# Patient Record
Sex: Female | Born: 1940
Health system: Southern US, Community
[De-identification: ages and names within clinical notes are randomized; demographics above are authoritative.]

## PROBLEM LIST (undated history)

## (undated) DIAGNOSIS — I10 Essential (primary) hypertension: Secondary | ICD-10-CM

## (undated) DIAGNOSIS — C801 Malignant (primary) neoplasm, unspecified: Secondary | ICD-10-CM

## (undated) DIAGNOSIS — I2510 Atherosclerotic heart disease of native coronary artery without angina pectoris: Secondary | ICD-10-CM

## (undated) DIAGNOSIS — G8929 Other chronic pain: Secondary | ICD-10-CM

## (undated) HISTORY — PX: CARDIAC CATHETERIZATION: SHX172

## (undated) HISTORY — PX: MASTECTOMY: SHX3

## (undated) HISTORY — PX: ABDOMINAL HYSTERECTOMY: SHX81

---

## 2016-03-03 DIAGNOSIS — I2510 Atherosclerotic heart disease of native coronary artery without angina pectoris: Secondary | ICD-10-CM | POA: Diagnosis not present

## 2016-03-03 DIAGNOSIS — E785 Hyperlipidemia, unspecified: Secondary | ICD-10-CM | POA: Diagnosis not present

## 2016-03-03 DIAGNOSIS — I10 Essential (primary) hypertension: Secondary | ICD-10-CM | POA: Diagnosis not present

## 2016-03-05 DIAGNOSIS — E785 Hyperlipidemia, unspecified: Secondary | ICD-10-CM | POA: Diagnosis not present

## 2016-03-05 DIAGNOSIS — E039 Hypothyroidism, unspecified: Secondary | ICD-10-CM | POA: Diagnosis not present

## 2016-03-05 DIAGNOSIS — Z853 Personal history of malignant neoplasm of breast: Secondary | ICD-10-CM | POA: Diagnosis not present

## 2016-03-05 DIAGNOSIS — N898 Other specified noninflammatory disorders of vagina: Secondary | ICD-10-CM | POA: Diagnosis not present

## 2016-03-05 DIAGNOSIS — I10 Essential (primary) hypertension: Secondary | ICD-10-CM | POA: Diagnosis not present

## 2016-03-05 DIAGNOSIS — D649 Anemia, unspecified: Secondary | ICD-10-CM | POA: Diagnosis not present

## 2016-03-25 DIAGNOSIS — N904 Leukoplakia of vulva: Secondary | ICD-10-CM | POA: Diagnosis not present

## 2016-03-25 DIAGNOSIS — N898 Other specified noninflammatory disorders of vagina: Secondary | ICD-10-CM | POA: Diagnosis not present

## 2016-04-02 DIAGNOSIS — D508 Other iron deficiency anemias: Secondary | ICD-10-CM | POA: Diagnosis not present

## 2016-04-02 DIAGNOSIS — D649 Anemia, unspecified: Secondary | ICD-10-CM | POA: Diagnosis not present

## 2016-04-14 DIAGNOSIS — G609 Hereditary and idiopathic neuropathy, unspecified: Secondary | ICD-10-CM | POA: Diagnosis not present

## 2016-04-14 DIAGNOSIS — F419 Anxiety disorder, unspecified: Secondary | ICD-10-CM | POA: Diagnosis not present

## 2016-04-14 DIAGNOSIS — I2510 Atherosclerotic heart disease of native coronary artery without angina pectoris: Secondary | ICD-10-CM | POA: Diagnosis not present

## 2016-04-14 DIAGNOSIS — I10 Essential (primary) hypertension: Secondary | ICD-10-CM | POA: Diagnosis not present

## 2016-04-14 DIAGNOSIS — G894 Chronic pain syndrome: Secondary | ICD-10-CM | POA: Diagnosis not present

## 2016-04-14 DIAGNOSIS — E785 Hyperlipidemia, unspecified: Secondary | ICD-10-CM | POA: Diagnosis not present

## 2016-04-14 DIAGNOSIS — M48061 Spinal stenosis, lumbar region without neurogenic claudication: Secondary | ICD-10-CM | POA: Diagnosis not present

## 2016-05-14 DIAGNOSIS — H6122 Impacted cerumen, left ear: Secondary | ICD-10-CM | POA: Diagnosis not present

## 2016-07-10 DIAGNOSIS — G894 Chronic pain syndrome: Secondary | ICD-10-CM | POA: Diagnosis not present

## 2016-07-10 DIAGNOSIS — M1711 Unilateral primary osteoarthritis, right knee: Secondary | ICD-10-CM | POA: Diagnosis not present

## 2016-07-10 DIAGNOSIS — M159 Polyosteoarthritis, unspecified: Secondary | ICD-10-CM | POA: Diagnosis not present

## 2016-08-26 DIAGNOSIS — H6120 Impacted cerumen, unspecified ear: Secondary | ICD-10-CM | POA: Diagnosis not present

## 2016-08-26 DIAGNOSIS — E039 Hypothyroidism, unspecified: Secondary | ICD-10-CM | POA: Diagnosis not present

## 2016-08-26 DIAGNOSIS — I10 Essential (primary) hypertension: Secondary | ICD-10-CM | POA: Diagnosis not present

## 2016-08-26 DIAGNOSIS — D649 Anemia, unspecified: Secondary | ICD-10-CM | POA: Diagnosis not present

## 2016-08-26 DIAGNOSIS — Z1389 Encounter for screening for other disorder: Secondary | ICD-10-CM | POA: Diagnosis not present

## 2016-08-26 DIAGNOSIS — E785 Hyperlipidemia, unspecified: Secondary | ICD-10-CM | POA: Diagnosis not present

## 2016-09-10 DIAGNOSIS — M1990 Unspecified osteoarthritis, unspecified site: Secondary | ICD-10-CM | POA: Diagnosis not present

## 2016-09-10 DIAGNOSIS — M609 Myositis, unspecified: Secondary | ICD-10-CM | POA: Diagnosis not present

## 2016-09-10 DIAGNOSIS — K625 Hemorrhage of anus and rectum: Secondary | ICD-10-CM | POA: Diagnosis not present

## 2016-09-10 DIAGNOSIS — I2510 Atherosclerotic heart disease of native coronary artery without angina pectoris: Secondary | ICD-10-CM | POA: Diagnosis not present

## 2016-09-10 DIAGNOSIS — Z7982 Long term (current) use of aspirin: Secondary | ICD-10-CM | POA: Diagnosis not present

## 2016-09-10 DIAGNOSIS — F419 Anxiety disorder, unspecified: Secondary | ICD-10-CM | POA: Diagnosis not present

## 2016-09-10 DIAGNOSIS — I10 Essential (primary) hypertension: Secondary | ICD-10-CM | POA: Diagnosis not present

## 2016-09-10 DIAGNOSIS — D649 Anemia, unspecified: Secondary | ICD-10-CM | POA: Diagnosis not present

## 2016-09-10 DIAGNOSIS — E785 Hyperlipidemia, unspecified: Secondary | ICD-10-CM | POA: Diagnosis not present

## 2016-09-10 DIAGNOSIS — E559 Vitamin D deficiency, unspecified: Secondary | ICD-10-CM | POA: Diagnosis not present

## 2016-09-10 DIAGNOSIS — E611 Iron deficiency: Secondary | ICD-10-CM | POA: Diagnosis not present

## 2016-09-10 DIAGNOSIS — E561 Deficiency of vitamin K: Secondary | ICD-10-CM | POA: Diagnosis not present

## 2016-09-10 DIAGNOSIS — Z7983 Long term (current) use of bisphosphonates: Secondary | ICD-10-CM | POA: Diagnosis not present

## 2016-09-10 DIAGNOSIS — Z9884 Bariatric surgery status: Secondary | ICD-10-CM | POA: Diagnosis not present

## 2016-09-10 DIAGNOSIS — Z853 Personal history of malignant neoplasm of breast: Secondary | ICD-10-CM | POA: Diagnosis not present

## 2016-09-10 DIAGNOSIS — E039 Hypothyroidism, unspecified: Secondary | ICD-10-CM | POA: Diagnosis not present

## 2016-09-10 DIAGNOSIS — E669 Obesity, unspecified: Secondary | ICD-10-CM | POA: Diagnosis not present

## 2016-09-10 DIAGNOSIS — G4700 Insomnia, unspecified: Secondary | ICD-10-CM | POA: Diagnosis not present

## 2016-09-12 DIAGNOSIS — E611 Iron deficiency: Secondary | ICD-10-CM | POA: Diagnosis not present

## 2016-10-02 DIAGNOSIS — E611 Iron deficiency: Secondary | ICD-10-CM | POA: Diagnosis not present

## 2016-10-06 DIAGNOSIS — G894 Chronic pain syndrome: Secondary | ICD-10-CM | POA: Diagnosis not present

## 2016-10-06 DIAGNOSIS — M6250 Muscle wasting and atrophy, not elsewhere classified, unspecified site: Secondary | ICD-10-CM | POA: Diagnosis not present

## 2016-10-06 DIAGNOSIS — M48061 Spinal stenosis, lumbar region without neurogenic claudication: Secondary | ICD-10-CM | POA: Diagnosis not present

## 2016-10-06 DIAGNOSIS — M1711 Unilateral primary osteoarthritis, right knee: Secondary | ICD-10-CM | POA: Diagnosis not present

## 2016-10-22 DIAGNOSIS — Z9884 Bariatric surgery status: Secondary | ICD-10-CM | POA: Diagnosis not present

## 2016-10-22 DIAGNOSIS — D509 Iron deficiency anemia, unspecified: Secondary | ICD-10-CM | POA: Diagnosis not present

## 2016-12-02 DIAGNOSIS — I10 Essential (primary) hypertension: Secondary | ICD-10-CM | POA: Diagnosis not present

## 2016-12-02 DIAGNOSIS — E559 Vitamin D deficiency, unspecified: Secondary | ICD-10-CM | POA: Diagnosis not present

## 2016-12-02 DIAGNOSIS — E039 Hypothyroidism, unspecified: Secondary | ICD-10-CM | POA: Diagnosis not present

## 2016-12-02 DIAGNOSIS — Z23 Encounter for immunization: Secondary | ICD-10-CM | POA: Diagnosis not present

## 2016-12-02 DIAGNOSIS — F329 Major depressive disorder, single episode, unspecified: Secondary | ICD-10-CM | POA: Diagnosis not present

## 2016-12-02 DIAGNOSIS — F5101 Primary insomnia: Secondary | ICD-10-CM | POA: Diagnosis not present

## 2016-12-19 DIAGNOSIS — R61 Generalized hyperhidrosis: Secondary | ICD-10-CM | POA: Diagnosis not present

## 2016-12-19 DIAGNOSIS — I10 Essential (primary) hypertension: Secondary | ICD-10-CM | POA: Diagnosis not present

## 2016-12-19 DIAGNOSIS — F5101 Primary insomnia: Secondary | ICD-10-CM | POA: Diagnosis not present

## 2016-12-31 DIAGNOSIS — M48061 Spinal stenosis, lumbar region without neurogenic claudication: Secondary | ICD-10-CM | POA: Diagnosis not present

## 2016-12-31 DIAGNOSIS — M1711 Unilateral primary osteoarthritis, right knee: Secondary | ICD-10-CM | POA: Diagnosis not present

## 2016-12-31 DIAGNOSIS — G894 Chronic pain syndrome: Secondary | ICD-10-CM | POA: Diagnosis not present

## 2016-12-31 DIAGNOSIS — M797 Fibromyalgia: Secondary | ICD-10-CM | POA: Diagnosis not present

## 2017-01-12 DIAGNOSIS — F5101 Primary insomnia: Secondary | ICD-10-CM | POA: Diagnosis not present

## 2017-01-12 DIAGNOSIS — I10 Essential (primary) hypertension: Secondary | ICD-10-CM | POA: Diagnosis not present

## 2017-01-29 DIAGNOSIS — E039 Hypothyroidism, unspecified: Secondary | ICD-10-CM | POA: Diagnosis not present

## 2017-01-29 DIAGNOSIS — Z823 Family history of stroke: Secondary | ICD-10-CM | POA: Diagnosis not present

## 2017-01-29 DIAGNOSIS — I959 Hypotension, unspecified: Secondary | ICD-10-CM | POA: Diagnosis not present

## 2017-01-29 DIAGNOSIS — M1711 Unilateral primary osteoarthritis, right knee: Secondary | ICD-10-CM | POA: Diagnosis not present

## 2017-01-29 DIAGNOSIS — R0602 Shortness of breath: Secondary | ICD-10-CM | POA: Diagnosis not present

## 2017-01-29 DIAGNOSIS — N179 Acute kidney failure, unspecified: Secondary | ICD-10-CM | POA: Diagnosis not present

## 2017-01-29 DIAGNOSIS — Z8371 Family history of colonic polyps: Secondary | ICD-10-CM | POA: Diagnosis not present

## 2017-01-29 DIAGNOSIS — E869 Volume depletion, unspecified: Secondary | ICD-10-CM | POA: Diagnosis not present

## 2017-01-29 DIAGNOSIS — M48061 Spinal stenosis, lumbar region without neurogenic claudication: Secondary | ICD-10-CM | POA: Diagnosis not present

## 2017-01-29 DIAGNOSIS — I10 Essential (primary) hypertension: Secondary | ICD-10-CM | POA: Diagnosis not present

## 2017-01-29 DIAGNOSIS — R55 Syncope and collapse: Secondary | ICD-10-CM | POA: Diagnosis not present

## 2017-01-29 DIAGNOSIS — Z90710 Acquired absence of both cervix and uterus: Secondary | ICD-10-CM | POA: Diagnosis not present

## 2017-01-29 DIAGNOSIS — G894 Chronic pain syndrome: Secondary | ICD-10-CM | POA: Diagnosis not present

## 2017-01-29 DIAGNOSIS — R531 Weakness: Secondary | ICD-10-CM | POA: Diagnosis not present

## 2017-01-29 DIAGNOSIS — G4700 Insomnia, unspecified: Secondary | ICD-10-CM | POA: Diagnosis not present

## 2017-01-29 DIAGNOSIS — M797 Fibromyalgia: Secondary | ICD-10-CM | POA: Diagnosis not present

## 2017-01-29 DIAGNOSIS — R079 Chest pain, unspecified: Secondary | ICD-10-CM | POA: Diagnosis not present

## 2017-01-29 DIAGNOSIS — Z8249 Family history of ischemic heart disease and other diseases of the circulatory system: Secondary | ICD-10-CM | POA: Diagnosis not present

## 2017-01-29 DIAGNOSIS — I2510 Atherosclerotic heart disease of native coronary artery without angina pectoris: Secondary | ICD-10-CM | POA: Diagnosis not present

## 2017-01-29 DIAGNOSIS — K5903 Drug induced constipation: Secondary | ICD-10-CM | POA: Diagnosis not present

## 2017-01-29 DIAGNOSIS — K219 Gastro-esophageal reflux disease without esophagitis: Secondary | ICD-10-CM | POA: Diagnosis not present

## 2017-01-29 DIAGNOSIS — D72829 Elevated white blood cell count, unspecified: Secondary | ICD-10-CM | POA: Diagnosis not present

## 2017-01-29 DIAGNOSIS — Z7982 Long term (current) use of aspirin: Secondary | ICD-10-CM | POA: Diagnosis not present

## 2017-01-29 DIAGNOSIS — W01198A Fall on same level from slipping, tripping and stumbling with subsequent striking against other object, initial encounter: Secondary | ICD-10-CM | POA: Diagnosis not present

## 2017-01-29 DIAGNOSIS — T50995A Adverse effect of other drugs, medicaments and biological substances, initial encounter: Secondary | ICD-10-CM | POA: Diagnosis not present

## 2017-01-29 DIAGNOSIS — Z9861 Coronary angioplasty status: Secondary | ICD-10-CM | POA: Diagnosis not present

## 2017-02-05 DIAGNOSIS — M1711 Unilateral primary osteoarthritis, right knee: Secondary | ICD-10-CM | POA: Diagnosis not present

## 2017-02-05 DIAGNOSIS — M797 Fibromyalgia: Secondary | ICD-10-CM | POA: Diagnosis not present

## 2017-02-05 DIAGNOSIS — G894 Chronic pain syndrome: Secondary | ICD-10-CM | POA: Diagnosis not present

## 2017-02-05 DIAGNOSIS — M48061 Spinal stenosis, lumbar region without neurogenic claudication: Secondary | ICD-10-CM | POA: Diagnosis not present

## 2017-02-06 ENCOUNTER — Other Ambulatory Visit: Payer: Self-pay | Admitting: *Deleted

## 2017-02-06 ENCOUNTER — Encounter: Payer: Self-pay | Admitting: *Deleted

## 2017-02-06 DIAGNOSIS — N179 Acute kidney failure, unspecified: Secondary | ICD-10-CM | POA: Diagnosis not present

## 2017-02-06 DIAGNOSIS — I9589 Other hypotension: Secondary | ICD-10-CM | POA: Diagnosis not present

## 2017-02-06 DIAGNOSIS — I10 Essential (primary) hypertension: Secondary | ICD-10-CM | POA: Diagnosis not present

## 2017-02-06 DIAGNOSIS — E861 Hypovolemia: Secondary | ICD-10-CM | POA: Diagnosis not present

## 2017-02-06 DIAGNOSIS — D72829 Elevated white blood cell count, unspecified: Secondary | ICD-10-CM | POA: Diagnosis not present

## 2017-02-06 NOTE — Patient Outreach (Signed)
[place] [facility]) Care Management  02/06/2017  Samantha Harper Nov 08, 1940 [id number]   Referral from [place]; Patient discharged from inpatient admission from [facility] 01/31/2017; determine if transition needs.   Call #1 to patient who was advised of reason for call & [facility] care management services.  HIPPA verification received from patient.   Patient voices that she was in [facility] from 12/19/-01/30/2017 due drop in blood pressure. States she had hospital follow visit with primary care provider today. Voices that she is currently taking blood pressure daily. Voices that MD has given her parameters of when to call for MD office for blood pressure readings.   Patient agrees to answer questions from transition of care assessment tool. Patient answered TOC questions but voices she is very uncomfortable giving list of medications over the phone.  Voices that she has all of her medications & takes as prescribed by her doctors. Voices she has no transportation problems & knows when she needs to call MD office or call 911. States she does have family support.   Patient offered [facility] services and advised of weekly follow up from care manager following hospital stay.  Patient voices that she is not interested in [facility] services at this time.    Plan: Send [facility] contact information to patient. Send to care management assistant for case closure. Unable to send provide closure letter (patient unable to supply full name of provider).   Sherrin Daisy, RN BSN [place] Management Coordinator [facility] Care Management  [phone]

## 2017-02-11 DIAGNOSIS — R8290 Unspecified abnormal findings in urine: Secondary | ICD-10-CM | POA: Diagnosis not present

## 2017-02-11 DIAGNOSIS — E039 Hypothyroidism, unspecified: Secondary | ICD-10-CM | POA: Diagnosis not present

## 2017-02-11 DIAGNOSIS — M810 Age-related osteoporosis without current pathological fracture: Secondary | ICD-10-CM | POA: Diagnosis not present

## 2017-02-11 DIAGNOSIS — I10 Essential (primary) hypertension: Secondary | ICD-10-CM | POA: Diagnosis not present

## 2017-02-11 DIAGNOSIS — D72829 Elevated white blood cell count, unspecified: Secondary | ICD-10-CM | POA: Diagnosis not present

## 2017-02-11 DIAGNOSIS — E785 Hyperlipidemia, unspecified: Secondary | ICD-10-CM | POA: Diagnosis not present

## 2017-02-11 DIAGNOSIS — R251 Tremor, unspecified: Secondary | ICD-10-CM | POA: Diagnosis not present

## 2017-02-18 DIAGNOSIS — M810 Age-related osteoporosis without current pathological fracture: Secondary | ICD-10-CM | POA: Diagnosis not present

## 2017-02-18 DIAGNOSIS — M7989 Other specified soft tissue disorders: Secondary | ICD-10-CM | POA: Diagnosis not present

## 2017-02-18 DIAGNOSIS — I10 Essential (primary) hypertension: Secondary | ICD-10-CM | POA: Diagnosis not present

## 2017-02-23 DIAGNOSIS — K047 Periapical abscess without sinus: Secondary | ICD-10-CM | POA: Diagnosis not present

## 2017-02-23 DIAGNOSIS — M109 Gout, unspecified: Secondary | ICD-10-CM | POA: Diagnosis not present

## 2017-02-23 DIAGNOSIS — M810 Age-related osteoporosis without current pathological fracture: Secondary | ICD-10-CM | POA: Diagnosis not present

## 2017-02-23 DIAGNOSIS — M7989 Other specified soft tissue disorders: Secondary | ICD-10-CM | POA: Diagnosis not present

## 2017-03-05 ENCOUNTER — Ambulatory Visit: Payer: PPO | Admitting: Neurology

## 2017-03-06 DIAGNOSIS — M48061 Spinal stenosis, lumbar region without neurogenic claudication: Secondary | ICD-10-CM | POA: Diagnosis not present

## 2017-03-06 DIAGNOSIS — G894 Chronic pain syndrome: Secondary | ICD-10-CM | POA: Diagnosis not present

## 2017-03-06 DIAGNOSIS — M109 Gout, unspecified: Secondary | ICD-10-CM | POA: Diagnosis not present

## 2017-03-06 DIAGNOSIS — M1711 Unilateral primary osteoarthritis, right knee: Secondary | ICD-10-CM | POA: Diagnosis not present

## 2017-03-11 DIAGNOSIS — D509 Iron deficiency anemia, unspecified: Secondary | ICD-10-CM | POA: Diagnosis not present

## 2017-03-11 DIAGNOSIS — I10 Essential (primary) hypertension: Secondary | ICD-10-CM | POA: Diagnosis not present

## 2017-03-11 DIAGNOSIS — E785 Hyperlipidemia, unspecified: Secondary | ICD-10-CM | POA: Diagnosis not present

## 2017-03-11 DIAGNOSIS — I2510 Atherosclerotic heart disease of native coronary artery without angina pectoris: Secondary | ICD-10-CM | POA: Diagnosis not present

## 2017-04-08 DIAGNOSIS — D2330 Other benign neoplasm of skin of unspecified part of face: Secondary | ICD-10-CM | POA: Diagnosis not present

## 2017-04-08 DIAGNOSIS — D225 Melanocytic nevi of trunk: Secondary | ICD-10-CM | POA: Diagnosis not present

## 2017-04-08 DIAGNOSIS — L821 Other seborrheic keratosis: Secondary | ICD-10-CM | POA: Diagnosis not present

## 2017-04-08 DIAGNOSIS — D1801 Hemangioma of skin and subcutaneous tissue: Secondary | ICD-10-CM | POA: Diagnosis not present

## 2017-04-08 DIAGNOSIS — L570 Actinic keratosis: Secondary | ICD-10-CM | POA: Diagnosis not present

## 2017-04-30 DIAGNOSIS — M109 Gout, unspecified: Secondary | ICD-10-CM | POA: Diagnosis not present

## 2017-04-30 DIAGNOSIS — G894 Chronic pain syndrome: Secondary | ICD-10-CM | POA: Diagnosis not present

## 2017-04-30 DIAGNOSIS — M1711 Unilateral primary osteoarthritis, right knee: Secondary | ICD-10-CM | POA: Diagnosis not present

## 2017-04-30 DIAGNOSIS — M48061 Spinal stenosis, lumbar region without neurogenic claudication: Secondary | ICD-10-CM | POA: Diagnosis not present

## 2017-05-28 DIAGNOSIS — H6122 Impacted cerumen, left ear: Secondary | ICD-10-CM | POA: Diagnosis not present

## 2017-05-28 DIAGNOSIS — R42 Dizziness and giddiness: Secondary | ICD-10-CM | POA: Diagnosis not present

## 2017-07-23 DIAGNOSIS — M1711 Unilateral primary osteoarthritis, right knee: Secondary | ICD-10-CM | POA: Diagnosis not present

## 2017-07-23 DIAGNOSIS — M797 Fibromyalgia: Secondary | ICD-10-CM | POA: Diagnosis not present

## 2017-07-23 DIAGNOSIS — G894 Chronic pain syndrome: Secondary | ICD-10-CM | POA: Diagnosis not present

## 2017-07-23 DIAGNOSIS — D509 Iron deficiency anemia, unspecified: Secondary | ICD-10-CM | POA: Diagnosis not present

## 2017-07-23 DIAGNOSIS — M48061 Spinal stenosis, lumbar region without neurogenic claudication: Secondary | ICD-10-CM | POA: Diagnosis not present

## 2017-08-11 DIAGNOSIS — E039 Hypothyroidism, unspecified: Secondary | ICD-10-CM | POA: Diagnosis not present

## 2017-08-11 DIAGNOSIS — R7301 Impaired fasting glucose: Secondary | ICD-10-CM | POA: Diagnosis not present

## 2017-08-11 DIAGNOSIS — I10 Essential (primary) hypertension: Secondary | ICD-10-CM | POA: Diagnosis not present

## 2017-08-11 DIAGNOSIS — E785 Hyperlipidemia, unspecified: Secondary | ICD-10-CM | POA: Diagnosis not present

## 2017-08-11 DIAGNOSIS — M810 Age-related osteoporosis without current pathological fracture: Secondary | ICD-10-CM | POA: Diagnosis not present

## 2017-08-11 DIAGNOSIS — N2581 Secondary hyperparathyroidism of renal origin: Secondary | ICD-10-CM | POA: Diagnosis not present

## 2017-09-10 ENCOUNTER — Other Ambulatory Visit: Payer: Self-pay

## 2017-09-10 NOTE — Patient Outreach (Signed)
[place] [facility]) Care Management  09/10/2017  Samantha Harper [birth year] [id number]   Telephone Screen  Referral Date: 09/10/17 Referral Source: Nurse Call Center Referral Reason: " caller states parathyroid is making her feel bad. Caller advises she is very tired. Has been going on for past 4 months." Insurance: HTA   Outreach attempt # 1 to patient. Spoke with patient. She states that she feels "a little better today." She attributes it to going to bed early last night and getting a good night sleep. Patient states that she still feels weak and tired all the time and relates it to her hyperparathyroidism. She states she has made an appt and is going to see endocrinologist in a few days. She is hopeful they will draw labs to check her levels. She states she is currently not on any treatment regimen for it but will talk with MD regarding this. Patient wanted to know symptoms of illness and RN CM provided here with some. She also voices tat she has anemia and used to have to get iron infusions. She is wondering if her levels have dropped. Advised patient that anemia can cause tiredness and weakness as well. She will talk with MD regarding this and see if those levels can be checked as well. She denies any further RN CM needs or concerns at this time. She was appreciative of follow up call.       Plan: RN CM will close case as no further interventions warranted at this time.   Enzo Montgomery, RN,BSN,CCM [place] Management Telephonic Care Management Coordinator Direct Phone: [phone] Toll Free: [phone] Fax: [phone]

## 2017-09-17 DIAGNOSIS — N2581 Secondary hyperparathyroidism of renal origin: Secondary | ICD-10-CM | POA: Diagnosis not present

## 2017-09-17 DIAGNOSIS — E559 Vitamin D deficiency, unspecified: Secondary | ICD-10-CM | POA: Diagnosis not present

## 2017-09-17 DIAGNOSIS — M810 Age-related osteoporosis without current pathological fracture: Secondary | ICD-10-CM | POA: Diagnosis not present

## 2017-09-17 DIAGNOSIS — R5383 Other fatigue: Secondary | ICD-10-CM | POA: Diagnosis not present

## 2017-10-20 DIAGNOSIS — I10 Essential (primary) hypertension: Secondary | ICD-10-CM | POA: Diagnosis not present

## 2017-10-20 DIAGNOSIS — M47816 Spondylosis without myelopathy or radiculopathy, lumbar region: Secondary | ICD-10-CM | POA: Diagnosis not present

## 2017-10-20 DIAGNOSIS — Z853 Personal history of malignant neoplasm of breast: Secondary | ICD-10-CM | POA: Diagnosis not present

## 2017-10-20 DIAGNOSIS — M1711 Unilateral primary osteoarthritis, right knee: Secondary | ICD-10-CM | POA: Diagnosis not present

## 2017-10-20 DIAGNOSIS — Z9884 Bariatric surgery status: Secondary | ICD-10-CM | POA: Diagnosis not present

## 2017-10-20 DIAGNOSIS — G8928 Other chronic postprocedural pain: Secondary | ICD-10-CM | POA: Diagnosis not present

## 2017-10-20 DIAGNOSIS — F329 Major depressive disorder, single episode, unspecified: Secondary | ICD-10-CM | POA: Diagnosis not present

## 2017-10-20 DIAGNOSIS — M7918 Myalgia, other site: Secondary | ICD-10-CM | POA: Diagnosis not present

## 2017-10-20 DIAGNOSIS — M4696 Unspecified inflammatory spondylopathy, lumbar region: Secondary | ICD-10-CM | POA: Diagnosis not present

## 2017-10-20 DIAGNOSIS — Z79891 Long term (current) use of opiate analgesic: Secondary | ICD-10-CM | POA: Diagnosis not present

## 2017-10-20 DIAGNOSIS — Z9013 Acquired absence of bilateral breasts and nipples: Secondary | ICD-10-CM | POA: Diagnosis not present

## 2017-10-20 DIAGNOSIS — I2510 Atherosclerotic heart disease of native coronary artery without angina pectoris: Secondary | ICD-10-CM | POA: Diagnosis not present

## 2017-10-20 DIAGNOSIS — Z79899 Other long term (current) drug therapy: Secondary | ICD-10-CM | POA: Diagnosis not present

## 2017-10-20 DIAGNOSIS — M170 Bilateral primary osteoarthritis of knee: Secondary | ICD-10-CM | POA: Diagnosis not present

## 2017-10-22 DIAGNOSIS — M8589 Other specified disorders of bone density and structure, multiple sites: Secondary | ICD-10-CM | POA: Diagnosis not present

## 2017-10-22 DIAGNOSIS — M85852 Other specified disorders of bone density and structure, left thigh: Secondary | ICD-10-CM | POA: Diagnosis not present

## 2017-10-22 DIAGNOSIS — M810 Age-related osteoporosis without current pathological fracture: Secondary | ICD-10-CM | POA: Diagnosis not present

## 2017-10-22 DIAGNOSIS — Z780 Asymptomatic menopausal state: Secondary | ICD-10-CM | POA: Diagnosis not present

## 2017-10-26 DIAGNOSIS — D509 Iron deficiency anemia, unspecified: Secondary | ICD-10-CM | POA: Diagnosis not present

## 2017-10-26 DIAGNOSIS — Z9884 Bariatric surgery status: Secondary | ICD-10-CM | POA: Diagnosis not present

## 2017-10-29 DIAGNOSIS — D509 Iron deficiency anemia, unspecified: Secondary | ICD-10-CM | POA: Diagnosis not present

## 2017-11-03 DIAGNOSIS — Z886 Allergy status to analgesic agent status: Secondary | ICD-10-CM | POA: Diagnosis not present

## 2017-11-03 DIAGNOSIS — Z9013 Acquired absence of bilateral breasts and nipples: Secondary | ICD-10-CM | POA: Diagnosis not present

## 2017-11-03 DIAGNOSIS — G8918 Other acute postprocedural pain: Secondary | ICD-10-CM | POA: Diagnosis not present

## 2017-11-03 DIAGNOSIS — R270 Ataxia, unspecified: Secondary | ICD-10-CM | POA: Diagnosis not present

## 2017-11-03 DIAGNOSIS — M7918 Myalgia, other site: Secondary | ICD-10-CM | POA: Diagnosis not present

## 2017-11-03 DIAGNOSIS — Z853 Personal history of malignant neoplasm of breast: Secondary | ICD-10-CM | POA: Diagnosis not present

## 2017-11-03 DIAGNOSIS — G8928 Other chronic postprocedural pain: Secondary | ICD-10-CM | POA: Diagnosis not present

## 2017-11-03 DIAGNOSIS — Z885 Allergy status to narcotic agent status: Secondary | ICD-10-CM | POA: Diagnosis not present

## 2017-11-03 DIAGNOSIS — T426X5A Adverse effect of other antiepileptic and sedative-hypnotic drugs, initial encounter: Secondary | ICD-10-CM | POA: Diagnosis not present

## 2017-11-17 DIAGNOSIS — R51 Headache: Secondary | ICD-10-CM | POA: Diagnosis not present

## 2017-11-17 DIAGNOSIS — H539 Unspecified visual disturbance: Secondary | ICD-10-CM | POA: Diagnosis not present

## 2017-11-17 DIAGNOSIS — T50905A Adverse effect of unspecified drugs, medicaments and biological substances, initial encounter: Secondary | ICD-10-CM | POA: Diagnosis not present

## 2017-11-17 DIAGNOSIS — R202 Paresthesia of skin: Secondary | ICD-10-CM | POA: Diagnosis not present

## 2017-11-17 DIAGNOSIS — R0602 Shortness of breath: Secondary | ICD-10-CM | POA: Diagnosis not present

## 2017-11-17 DIAGNOSIS — Z760 Encounter for issue of repeat prescription: Secondary | ICD-10-CM | POA: Diagnosis not present

## 2017-11-17 DIAGNOSIS — R200 Anesthesia of skin: Secondary | ICD-10-CM | POA: Diagnosis not present

## 2017-11-17 DIAGNOSIS — G8929 Other chronic pain: Secondary | ICD-10-CM | POA: Diagnosis not present

## 2017-11-17 DIAGNOSIS — R079 Chest pain, unspecified: Secondary | ICD-10-CM | POA: Diagnosis not present

## 2017-12-01 DIAGNOSIS — Z029 Encounter for administrative examinations, unspecified: Secondary | ICD-10-CM | POA: Diagnosis not present

## 2017-12-01 DIAGNOSIS — M7918 Myalgia, other site: Secondary | ICD-10-CM | POA: Diagnosis not present

## 2017-12-01 DIAGNOSIS — Z79899 Other long term (current) drug therapy: Secondary | ICD-10-CM | POA: Diagnosis not present

## 2017-12-01 DIAGNOSIS — F331 Major depressive disorder, recurrent, moderate: Secondary | ICD-10-CM | POA: Diagnosis not present

## 2017-12-01 DIAGNOSIS — M48061 Spinal stenosis, lumbar region without neurogenic claudication: Secondary | ICD-10-CM | POA: Diagnosis not present

## 2017-12-01 DIAGNOSIS — G8928 Other chronic postprocedural pain: Secondary | ICD-10-CM | POA: Diagnosis not present

## 2017-12-01 DIAGNOSIS — Z79891 Long term (current) use of opiate analgesic: Secondary | ICD-10-CM | POA: Diagnosis not present

## 2017-12-07 DIAGNOSIS — M810 Age-related osteoporosis without current pathological fracture: Secondary | ICD-10-CM | POA: Diagnosis not present

## 2017-12-07 DIAGNOSIS — E039 Hypothyroidism, unspecified: Secondary | ICD-10-CM | POA: Diagnosis not present

## 2017-12-07 DIAGNOSIS — E559 Vitamin D deficiency, unspecified: Secondary | ICD-10-CM | POA: Diagnosis not present

## 2017-12-09 DIAGNOSIS — M810 Age-related osteoporosis without current pathological fracture: Secondary | ICD-10-CM | POA: Diagnosis not present

## 2017-12-16 DIAGNOSIS — H6122 Impacted cerumen, left ear: Secondary | ICD-10-CM | POA: Diagnosis not present

## 2017-12-16 DIAGNOSIS — H8112 Benign paroxysmal vertigo, left ear: Secondary | ICD-10-CM | POA: Diagnosis not present

## 2018-02-16 DIAGNOSIS — R0789 Other chest pain: Secondary | ICD-10-CM | POA: Diagnosis not present

## 2018-02-16 DIAGNOSIS — G8929 Other chronic pain: Secondary | ICD-10-CM | POA: Diagnosis not present

## 2018-02-16 DIAGNOSIS — M47816 Spondylosis without myelopathy or radiculopathy, lumbar region: Secondary | ICD-10-CM | POA: Diagnosis not present

## 2018-02-16 DIAGNOSIS — Z79899 Other long term (current) drug therapy: Secondary | ICD-10-CM | POA: Diagnosis not present

## 2018-02-16 DIAGNOSIS — M25551 Pain in right hip: Secondary | ICD-10-CM | POA: Diagnosis not present

## 2018-02-16 DIAGNOSIS — G894 Chronic pain syndrome: Secondary | ICD-10-CM | POA: Diagnosis not present

## 2018-02-16 DIAGNOSIS — M5416 Radiculopathy, lumbar region: Secondary | ICD-10-CM | POA: Diagnosis not present

## 2018-02-24 DIAGNOSIS — M5127 Other intervertebral disc displacement, lumbosacral region: Secondary | ICD-10-CM | POA: Diagnosis not present

## 2018-02-24 DIAGNOSIS — M48061 Spinal stenosis, lumbar region without neurogenic claudication: Secondary | ICD-10-CM | POA: Diagnosis not present

## 2018-02-24 DIAGNOSIS — M4726 Other spondylosis with radiculopathy, lumbar region: Secondary | ICD-10-CM | POA: Diagnosis not present

## 2018-02-24 DIAGNOSIS — M5416 Radiculopathy, lumbar region: Secondary | ICD-10-CM | POA: Diagnosis not present

## 2018-03-05 DIAGNOSIS — D485 Neoplasm of uncertain behavior of skin: Secondary | ICD-10-CM | POA: Diagnosis not present

## 2018-03-05 DIAGNOSIS — L570 Actinic keratosis: Secondary | ICD-10-CM | POA: Diagnosis not present

## 2018-03-05 DIAGNOSIS — D3610 Benign neoplasm of peripheral nerves and autonomic nervous system, unspecified: Secondary | ICD-10-CM | POA: Diagnosis not present

## 2018-03-10 DIAGNOSIS — E559 Vitamin D deficiency, unspecified: Secondary | ICD-10-CM | POA: Diagnosis not present

## 2018-03-10 DIAGNOSIS — I10 Essential (primary) hypertension: Secondary | ICD-10-CM | POA: Diagnosis not present

## 2018-03-10 DIAGNOSIS — G894 Chronic pain syndrome: Secondary | ICD-10-CM | POA: Diagnosis not present

## 2018-03-10 DIAGNOSIS — F5101 Primary insomnia: Secondary | ICD-10-CM | POA: Diagnosis not present

## 2018-03-10 DIAGNOSIS — I2510 Atherosclerotic heart disease of native coronary artery without angina pectoris: Secondary | ICD-10-CM | POA: Diagnosis not present

## 2018-03-12 DIAGNOSIS — I10 Essential (primary) hypertension: Secondary | ICD-10-CM | POA: Diagnosis not present

## 2018-03-12 DIAGNOSIS — I2510 Atherosclerotic heart disease of native coronary artery without angina pectoris: Secondary | ICD-10-CM | POA: Diagnosis not present

## 2018-03-12 DIAGNOSIS — E559 Vitamin D deficiency, unspecified: Secondary | ICD-10-CM | POA: Diagnosis not present

## 2018-03-22 DIAGNOSIS — M25551 Pain in right hip: Secondary | ICD-10-CM | POA: Diagnosis not present

## 2018-03-22 DIAGNOSIS — G588 Other specified mononeuropathies: Secondary | ICD-10-CM | POA: Diagnosis not present

## 2018-03-22 DIAGNOSIS — M5416 Radiculopathy, lumbar region: Secondary | ICD-10-CM | POA: Diagnosis not present

## 2018-04-05 DIAGNOSIS — G588 Other specified mononeuropathies: Secondary | ICD-10-CM | POA: Diagnosis not present

## 2018-04-20 DIAGNOSIS — M5416 Radiculopathy, lumbar region: Secondary | ICD-10-CM | POA: Diagnosis not present

## 2018-04-20 DIAGNOSIS — M25561 Pain in right knee: Secondary | ICD-10-CM | POA: Diagnosis not present

## 2018-04-20 DIAGNOSIS — M25562 Pain in left knee: Secondary | ICD-10-CM | POA: Diagnosis not present

## 2018-04-20 DIAGNOSIS — Z79891 Long term (current) use of opiate analgesic: Secondary | ICD-10-CM | POA: Diagnosis not present

## 2018-04-20 DIAGNOSIS — G8929 Other chronic pain: Secondary | ICD-10-CM | POA: Diagnosis not present

## 2018-05-11 DIAGNOSIS — G894 Chronic pain syndrome: Secondary | ICD-10-CM | POA: Diagnosis not present

## 2018-05-11 DIAGNOSIS — M5416 Radiculopathy, lumbar region: Secondary | ICD-10-CM | POA: Diagnosis not present

## 2018-05-11 DIAGNOSIS — Z79891 Long term (current) use of opiate analgesic: Secondary | ICD-10-CM | POA: Diagnosis not present

## 2018-05-11 DIAGNOSIS — M792 Neuralgia and neuritis, unspecified: Secondary | ICD-10-CM | POA: Diagnosis not present

## 2018-05-11 DIAGNOSIS — G588 Other specified mononeuropathies: Secondary | ICD-10-CM | POA: Diagnosis not present

## 2018-05-11 DIAGNOSIS — M25561 Pain in right knee: Secondary | ICD-10-CM | POA: Diagnosis not present

## 2018-05-21 DIAGNOSIS — H938X2 Other specified disorders of left ear: Secondary | ICD-10-CM | POA: Diagnosis not present

## 2018-05-25 DIAGNOSIS — G8928 Other chronic postprocedural pain: Secondary | ICD-10-CM | POA: Diagnosis not present

## 2018-05-25 DIAGNOSIS — Z79891 Long term (current) use of opiate analgesic: Secondary | ICD-10-CM | POA: Diagnosis not present

## 2018-05-25 DIAGNOSIS — Z79899 Other long term (current) drug therapy: Secondary | ICD-10-CM | POA: Diagnosis not present

## 2018-05-25 DIAGNOSIS — M7918 Myalgia, other site: Secondary | ICD-10-CM | POA: Diagnosis not present

## 2018-05-25 DIAGNOSIS — G588 Other specified mononeuropathies: Secondary | ICD-10-CM | POA: Diagnosis not present

## 2018-05-25 DIAGNOSIS — M792 Neuralgia and neuritis, unspecified: Secondary | ICD-10-CM | POA: Diagnosis not present

## 2018-07-06 DIAGNOSIS — Z79891 Long term (current) use of opiate analgesic: Secondary | ICD-10-CM | POA: Diagnosis not present

## 2018-07-06 DIAGNOSIS — G588 Other specified mononeuropathies: Secondary | ICD-10-CM | POA: Diagnosis not present

## 2018-07-06 DIAGNOSIS — M792 Neuralgia and neuritis, unspecified: Secondary | ICD-10-CM | POA: Diagnosis not present

## 2018-07-06 DIAGNOSIS — M7918 Myalgia, other site: Secondary | ICD-10-CM | POA: Diagnosis not present

## 2018-07-06 DIAGNOSIS — G894 Chronic pain syndrome: Secondary | ICD-10-CM | POA: Diagnosis not present

## 2018-07-29 DIAGNOSIS — I2510 Atherosclerotic heart disease of native coronary artery without angina pectoris: Secondary | ICD-10-CM | POA: Diagnosis not present

## 2018-07-29 DIAGNOSIS — I10 Essential (primary) hypertension: Secondary | ICD-10-CM | POA: Diagnosis not present

## 2018-07-29 DIAGNOSIS — G894 Chronic pain syndrome: Secondary | ICD-10-CM | POA: Diagnosis not present

## 2018-07-29 DIAGNOSIS — D509 Iron deficiency anemia, unspecified: Secondary | ICD-10-CM | POA: Diagnosis not present

## 2018-07-29 DIAGNOSIS — R0789 Other chest pain: Secondary | ICD-10-CM | POA: Diagnosis not present

## 2018-07-29 DIAGNOSIS — E785 Hyperlipidemia, unspecified: Secondary | ICD-10-CM | POA: Diagnosis not present

## 2018-08-02 DIAGNOSIS — I080 Rheumatic disorders of both mitral and aortic valves: Secondary | ICD-10-CM | POA: Diagnosis not present

## 2018-08-10 DIAGNOSIS — G894 Chronic pain syndrome: Secondary | ICD-10-CM | POA: Diagnosis not present

## 2018-08-10 DIAGNOSIS — M5416 Radiculopathy, lumbar region: Secondary | ICD-10-CM | POA: Diagnosis not present

## 2018-08-10 DIAGNOSIS — M792 Neuralgia and neuritis, unspecified: Secondary | ICD-10-CM | POA: Diagnosis not present

## 2018-08-10 DIAGNOSIS — G588 Other specified mononeuropathies: Secondary | ICD-10-CM | POA: Diagnosis not present

## 2018-08-10 DIAGNOSIS — Z79891 Long term (current) use of opiate analgesic: Secondary | ICD-10-CM | POA: Diagnosis not present

## 2018-09-09 DIAGNOSIS — Z9884 Bariatric surgery status: Secondary | ICD-10-CM | POA: Diagnosis not present

## 2018-09-09 DIAGNOSIS — D509 Iron deficiency anemia, unspecified: Secondary | ICD-10-CM | POA: Diagnosis not present

## 2018-09-10 DIAGNOSIS — C9110 Chronic lymphocytic leukemia of B-cell type not having achieved remission: Secondary | ICD-10-CM | POA: Diagnosis not present

## 2018-09-10 DIAGNOSIS — D509 Iron deficiency anemia, unspecified: Secondary | ICD-10-CM | POA: Diagnosis not present

## 2018-10-05 DIAGNOSIS — M5416 Radiculopathy, lumbar region: Secondary | ICD-10-CM | POA: Diagnosis not present

## 2018-10-05 DIAGNOSIS — G588 Other specified mononeuropathies: Secondary | ICD-10-CM | POA: Diagnosis not present

## 2018-10-05 DIAGNOSIS — M792 Neuralgia and neuritis, unspecified: Secondary | ICD-10-CM | POA: Diagnosis not present

## 2018-10-05 DIAGNOSIS — Z79891 Long term (current) use of opiate analgesic: Secondary | ICD-10-CM | POA: Diagnosis not present

## 2018-10-05 DIAGNOSIS — G894 Chronic pain syndrome: Secondary | ICD-10-CM | POA: Diagnosis not present

## 2018-10-05 DIAGNOSIS — M25551 Pain in right hip: Secondary | ICD-10-CM | POA: Diagnosis not present

## 2018-10-12 DIAGNOSIS — R1900 Intra-abdominal and pelvic swelling, mass and lump, unspecified site: Secondary | ICD-10-CM | POA: Diagnosis not present

## 2018-10-12 DIAGNOSIS — C9110 Chronic lymphocytic leukemia of B-cell type not having achieved remission: Secondary | ICD-10-CM | POA: Diagnosis not present

## 2018-10-12 DIAGNOSIS — D72820 Lymphocytosis (symptomatic): Secondary | ICD-10-CM | POA: Diagnosis not present

## 2018-10-12 DIAGNOSIS — N200 Calculus of kidney: Secondary | ICD-10-CM | POA: Diagnosis not present

## 2018-10-13 DIAGNOSIS — C9110 Chronic lymphocytic leukemia of B-cell type not having achieved remission: Secondary | ICD-10-CM | POA: Diagnosis not present

## 2018-10-13 DIAGNOSIS — D72820 Lymphocytosis (symptomatic): Secondary | ICD-10-CM | POA: Diagnosis not present

## 2018-10-13 DIAGNOSIS — D509 Iron deficiency anemia, unspecified: Secondary | ICD-10-CM | POA: Diagnosis not present

## 2018-10-13 DIAGNOSIS — Z9884 Bariatric surgery status: Secondary | ICD-10-CM | POA: Diagnosis not present

## 2018-12-13 DIAGNOSIS — R635 Abnormal weight gain: Secondary | ICD-10-CM | POA: Diagnosis not present

## 2018-12-13 DIAGNOSIS — E039 Hypothyroidism, unspecified: Secondary | ICD-10-CM | POA: Diagnosis not present

## 2018-12-13 DIAGNOSIS — I10 Essential (primary) hypertension: Secondary | ICD-10-CM | POA: Diagnosis not present

## 2018-12-13 DIAGNOSIS — M810 Age-related osteoporosis without current pathological fracture: Secondary | ICD-10-CM | POA: Diagnosis not present

## 2018-12-13 DIAGNOSIS — R000 Tachycardia, unspecified: Secondary | ICD-10-CM | POA: Diagnosis not present

## 2018-12-13 DIAGNOSIS — E559 Vitamin D deficiency, unspecified: Secondary | ICD-10-CM | POA: Diagnosis not present

## 2018-12-14 DIAGNOSIS — Z79891 Long term (current) use of opiate analgesic: Secondary | ICD-10-CM | POA: Diagnosis not present

## 2018-12-14 DIAGNOSIS — M5416 Radiculopathy, lumbar region: Secondary | ICD-10-CM | POA: Diagnosis not present

## 2018-12-14 DIAGNOSIS — Z79899 Other long term (current) drug therapy: Secondary | ICD-10-CM | POA: Diagnosis not present

## 2018-12-14 DIAGNOSIS — G894 Chronic pain syndrome: Secondary | ICD-10-CM | POA: Diagnosis not present

## 2018-12-17 ENCOUNTER — Emergency Department (HOSPITAL_BASED_OUTPATIENT_CLINIC_OR_DEPARTMENT_OTHER): Payer: PPO

## 2018-12-17 ENCOUNTER — Other Ambulatory Visit: Payer: Self-pay

## 2018-12-17 ENCOUNTER — Encounter (HOSPITAL_BASED_OUTPATIENT_CLINIC_OR_DEPARTMENT_OTHER): Payer: Self-pay | Admitting: Emergency Medicine

## 2018-12-17 ENCOUNTER — Emergency Department (HOSPITAL_BASED_OUTPATIENT_CLINIC_OR_DEPARTMENT_OTHER)
Admission: EM | Admit: 2018-12-17 | Discharge: 2018-12-17 | Disposition: A | Discharge status: Home / Self Care | Payer: PPO | Attending: Emergency Medicine | Admitting: Emergency Medicine

## 2018-12-17 DIAGNOSIS — R0902 Hypoxemia: Secondary | ICD-10-CM | POA: Diagnosis not present

## 2018-12-17 DIAGNOSIS — Z7982 Long term (current) use of aspirin: Secondary | ICD-10-CM | POA: Insufficient documentation

## 2018-12-17 DIAGNOSIS — I10 Essential (primary) hypertension: Secondary | ICD-10-CM | POA: Insufficient documentation

## 2018-12-17 DIAGNOSIS — R52 Pain, unspecified: Secondary | ICD-10-CM | POA: Diagnosis not present

## 2018-12-17 DIAGNOSIS — I2510 Atherosclerotic heart disease of native coronary artery without angina pectoris: Secondary | ICD-10-CM | POA: Diagnosis not present

## 2018-12-17 DIAGNOSIS — I213 ST elevation (STEMI) myocardial infarction of unspecified site: Secondary | ICD-10-CM | POA: Diagnosis not present

## 2018-12-17 DIAGNOSIS — R519 Headache, unspecified: Secondary | ICD-10-CM | POA: Diagnosis not present

## 2018-12-17 DIAGNOSIS — R001 Bradycardia, unspecified: Secondary | ICD-10-CM | POA: Diagnosis not present

## 2018-12-17 DIAGNOSIS — Z79899 Other long term (current) drug therapy: Secondary | ICD-10-CM | POA: Insufficient documentation

## 2018-12-17 HISTORY — DX: Essential (primary) hypertension: I10

## 2018-12-17 HISTORY — DX: Other chronic pain: G89.29

## 2018-12-17 HISTORY — DX: Malignant (primary) neoplasm, unspecified: C80.1

## 2018-12-17 HISTORY — DX: Atherosclerotic heart disease of native coronary artery without angina pectoris: I25.10

## 2018-12-17 LAB — CBC WITH DIFFERENTIAL/PLATELET
Abs Immature Granulocytes: 0.09 10*3/uL — ABNORMAL HIGH (ref 0.00–0.07)
Basophils Absolute: 0.1 10*3/uL (ref 0.0–0.1)
Basophils Relative: 0 %
Eosinophils Absolute: 0.1 10*3/uL (ref 0.0–0.5)
Eosinophils Relative: 1 %
HCT: 44.3 % (ref 36.0–46.0)
Hemoglobin: 14.1 g/dL (ref 12.0–15.0)
Immature Granulocytes: 1 %
Lymphocytes Relative: 58 %
Lymphs Abs: 8.1 10*3/uL — ABNORMAL HIGH (ref 0.7–4.0)
MCH: 30.3 pg (ref 26.0–34.0)
MCHC: 31.8 g/dL (ref 30.0–36.0)
MCV: 95.1 fL (ref 80.0–100.0)
Monocytes Absolute: 0.8 10*3/uL (ref 0.1–1.0)
Monocytes Relative: 5 %
Neutro Abs: 4.9 10*3/uL (ref 1.7–7.7)
Neutrophils Relative %: 35 %
Platelets: 260 10*3/uL (ref 150–400)
RBC: 4.66 MIL/uL (ref 3.87–5.11)
RDW: 13.2 % (ref 11.5–15.5)
WBC: 14 10*3/uL — ABNORMAL HIGH (ref 4.0–10.5)
nRBC: 0 % (ref 0.0–0.2)

## 2018-12-17 LAB — COMPREHENSIVE METABOLIC PANEL
ALT: 14 U/L (ref 0–44)
AST: 16 U/L (ref 15–41)
Albumin: 4.1 g/dL (ref 3.5–5.0)
Alkaline Phosphatase: 88 U/L (ref 38–126)
Anion gap: 9 (ref 5–15)
BUN: 16 mg/dL (ref 8–23)
CO2: 28 mmol/L (ref 22–32)
Calcium: 9.3 mg/dL (ref 8.9–10.3)
Chloride: 103 mmol/L (ref 98–111)
Creatinine, Ser: 0.59 mg/dL (ref 0.44–1.00)
GFR calc Af Amer: >60 mL/min (ref >60)
GFR calc non Af Amer: >60 mL/min (ref >60)
Glucose, Bld: 107 mg/dL — ABNORMAL HIGH (ref 70–99)
Potassium: 4 mmol/L (ref 3.5–5.1)
Sodium: 140 mmol/L (ref 135–145)
Total Bilirubin: 0.8 mg/dL (ref 0.3–1.2)
Total Protein: 6.7 g/dL (ref 6.5–8.1)

## 2018-12-17 NOTE — ED Triage Notes (Signed)
EMS arrival form [facility] Internal Medicine for HTN. Given clonidine 0.1mg  at 1430. at MD office for elevated BP of 222/114.  Pt c/o headache and chronic pain generalized.

## 2018-12-17 NOTE — ED Provider Notes (Signed)
Samantha Harper Provider Note   CSN: [id number] Arrival date & time: 12/17/18  1530     History   Chief Complaint Chief Complaint  Patient presents with  . Hypertension    HPI Samantha Harper is a 78 y.o. female.     Patient with a history of high blood pressure.  Currently treated just with Cozaar.  Has had other medications in the past but did had a very severe hypotensive episode secondary that.  For this past week patient's blood pressures have been elevated sometimes systolics as high as [id number].  Went to urgent care the other day was treated with clonidine pressure came down but then it was back up was at primary care office today given clonidine again at 2:30 in the afternoon.  Blood pressure was 222/114.  Upon arrival here 164/96.  Patient has had the complaint of a dull headache for a week or so.  Patient has known unchanged generalized chronic pain which is mostly related to her back.  Patient denies any visual changes any speech problems any numbness or weakness.  No significant chest pain no shortness of breath.  Does not define the headache is severe.  Patient's primary care doctor did make an adjustment in her medications and phoned prescriptions and for the patient to pick up to start a new treatment regiment.  Patient does live at home by herself.  Patient does have the ability check her blood pressure at home.  Past medical history otherwise other than the chronic pain is significant for history of cancer CLL.  Coronary disease and hypertension.  In addition patient has had the complaint of some diaphoresis mostly sweats where head gets very sweaty.  Primary care doctor her endocrinologist is taken a look at her thyroid test recently and they were fine.     Past Medical History:  Diagnosis Date  . Cancer ([place])   . Chronic pain   . Coronary artery disease   . Hypertension     There are no active problems to display for this patient.   Past  Surgical History:  Procedure Laterality Date  . ABDOMINAL HYSTERECTOMY    . CARDIAC CATHETERIZATION    . MASTECTOMY    . MASTECTOMY       OB History   No obstetric history on file.      Home Medications    Prior to Admission medications   Medication Sig Start Date End Date Taking? Authorizing Provider  DULoxetine (CYMBALTA) 30 MG capsule Take by mouth. 10/05/18  Yes [provider]  estrogens, conjugated, (PREMARIN) 0.625 MG tablet  05/25/09  Yes [provider]  HYDROcodone-acetaminophen (NORCO) 10-325 MG tablet Take by mouth. 10/18/18 01/18/19 Yes [provider]  lidocaine (LIDODERM) 5 % Apply patch to painful area. Patch may remain in place for up to 12 hours in a 24 hour period. 05/25/18  Yes [provider]  meclizine (ANTIVERT) 12.5 MG tablet TAKE 1 TABLET(12.5 MG) BY MOUTH THREE TIMES DAILY FOR UP TO 10 DAYS AS NEEDED 09/30/17  Yes [provider]  pregabalin (LYRICA) 75 MG capsule Take by mouth. 10/05/18  Yes [provider]  tiZANidine (ZANAFLEX) 4 MG tablet  12/04/18  Yes [provider]  aspirin EC 81 MG tablet Take 81 mg by mouth daily.    [provider]  citalopram (CELEXA) 10 MG tablet Take 10 mg by mouth daily.    [provider]  fentaNYL (DURAGESIC - DOSED MCG/HR) 25  MCG/HR patch Place 25 mcg onto the skin every 3 (three) days.    [provider]  gabapentin (NEURONTIN) 600 MG tablet Take 600 mg by mouth 2 (two) times daily.    [provider]  levothyroxine (SYNTHROID, LEVOTHROID) 112 MCG tablet Take 112 mcg by mouth daily before breakfast.    [provider]  losartan (COZAAR) 50 MG tablet Take 50 mg by mouth daily. 12/04/18   [provider]  metoprolol tartrate (LOPRESSOR) 25 MG tablet Take 25 mg by mouth 2 (two) times daily. 12/13/18   [provider]  telmisartan (MICARDIS) 20 MG tablet Take 20 mg by mouth daily.    [provider]   traZODone (DESYREL) 100 MG tablet Take 100 mg by mouth at bedtime.    [provider]    Family History No family history on file.  Social History Social History   Tobacco Use  . Smoking status: Never Smoker  . Smokeless tobacco: Never Used  Substance Use Topics  . Alcohol use: Never    Frequency: Never  . Drug use: Never     Allergies   Codeine, Meperidine, Oxycodone-acetaminophen, Buprenorphine, and Morphine   Review of Systems Review of Systems  Constitutional: Positive for diaphoresis. Negative for chills and fever.  HENT: Negative for congestion, rhinorrhea and sore throat.   Eyes: Negative for visual disturbance.  Respiratory: Negative for cough and shortness of breath.   Cardiovascular: Negative for chest pain and leg swelling.  Gastrointestinal: Negative for abdominal pain, diarrhea, nausea and vomiting.  Genitourinary: Negative for dysuria.  Musculoskeletal: Positive for back pain. Negative for neck pain.  Skin: Negative for rash.  Neurological: Positive for headaches. Negative for dizziness and light-headedness.  Hematological: Does not bruise/bleed easily.  Psychiatric/Behavioral: Negative for confusion.     Physical Exam Updated Vital Signs BP (!) 171/81   Pulse (!) 57   Temp 98.7 F (37.1 C)   Resp 16   Ht 1.549 m (5\' 1" )   Wt 93 kg   SpO2 98%   BMI 38.73 kg/m   Physical Exam Vitals signs and nursing note reviewed.  Constitutional:      General: She is not in acute distress.    Appearance: Normal appearance. She is well-developed.  HENT:     Head: Normocephalic and atraumatic.  Eyes:     Extraocular Movements: Extraocular movements intact.     Conjunctiva/sclera: Conjunctivae normal.     Pupils: Pupils are equal, round, and reactive to light.  Neck:     Musculoskeletal: Normal range of motion and neck supple.  Cardiovascular:     Rate and Rhythm: Normal rate and regular rhythm.     Heart sounds: No murmur.  Pulmonary:      Effort: Pulmonary effort is normal. No respiratory distress.     Breath sounds: Normal breath sounds. No wheezing or rales.  Abdominal:     Palpations: Abdomen is soft.     Tenderness: There is no abdominal tenderness.  Musculoskeletal: Normal range of motion.        General: No swelling.  Skin:    General: Skin is warm and dry.     Capillary Refill: Capillary refill takes less than 2 seconds.  Neurological:     General: No focal deficit present.     Mental Status: She is alert and oriented to person, place, and time.     Cranial Nerves: No cranial nerve deficit.     Sensory: No sensory deficit.  Motor: No weakness.     Coordination: Coordination normal.      ED Treatments / Results  Labs (all labs ordered are listed, but only abnormal results are displayed) Labs Reviewed  CBC WITH DIFFERENTIAL/PLATELET - Abnormal; Notable for the following components:      Result Value   WBC 14.0 (*)    Lymphs Abs 8.1 (*)    Abs Immature Granulocytes 0.09 (*)    All other components within normal limits  COMPREHENSIVE METABOLIC PANEL - Abnormal; Notable for the following components:   Glucose, Bld 107 (*)    All other components within normal limits  PATHOLOGIST SMEAR REVIEW    EKG None  Radiology Dg Chest 2 View  Result Date: 12/17/2018 CLINICAL DATA:  Hypertension EXAM: CHEST - 2 VIEW COMPARISON:  11/17/2017 FINDINGS: Mild cardiomegaly. No focal opacity or pleural effusion. Tortuous aorta. No pneumothorax. IMPRESSION: No active cardiopulmonary disease. Electronically Signed   By: Donavan Foil M.D.   On: 12/17/2018 16:55   Ct Head Wo Contrast  Result Date: 12/17/2018 CLINICAL DATA:  Headache, acute, severe, worse headache of life. Additional history provided: Headache and dizziness today, elevated blood pressure. EXAM: CT HEAD WITHOUT CONTRAST TECHNIQUE: Contiguous axial images were obtained from the base of the skull through the vertex without intravenous contrast. COMPARISON:   Head CT 08/18/2014 FINDINGS: Brain: No evidence of acute intracranial hemorrhage. No demarcated cortical infarction. No evidence of intracranial mass. No midline shift or extra-axial fluid collection. Minimal ill-defined hypoattenuation within the cerebral white matter is nonspecific, but consistent with chronic small vessel ischemic disease. Mild generalized parenchymal atrophy. Redemonstrated partially empty sella turcica. Vascular: No hyperdense vessel.  Atherosclerotic calcifications Skull: Normal. Negative for fracture or focal lesion. Sinuses/Orbits: Visualized orbits demonstrate no acute abnormality. No significant paranasal sinus disease or mastoid effusion at the imaged levels. IMPRESSION: 1. No evidence of acute intracranial abnormality. 2. Mild generalized parenchymal atrophy and chronic small vessel ischemic disease. Electronically Signed   By: Kellie Simmering DO   On: 12/17/2018 16:53    Procedures Procedures (including critical care time)  Medications Ordered in ED Medications - No data to display   Initial Impression / Assessment and Plan / ED Course  I have reviewed the triage vital signs and the nursing notes.  Pertinent labs & imaging results that were available during my care of the patient were reviewed by me and considered in my medical decision making (see chart for details).        Patient nontoxic no acute distress.  Due to the mild headache CT head was done without any acute findings.  Labs were rechecked.  Chest x-ray was done.  No acute findings on the chest x-ray labs without significant abnormalities.  Patient's blood pressure here ranged from systolic of 1 [id number].  Diastolics were all below 90.  Work-up without any evidence of hypertensive emergency.  Patient without any significant symptoms.  Primary care doctor has made adjustments in her blood pressure medication regiment which she can start tomorrow.  Patient also has the ability to keep a blood pressure log at  home.  Patient given precautions on when to return also given target goals with her blood pressure.  Patient was given her white blood cell count of 14,000 platelets were fine.  Feel that the white blood cell count may be within normal range for her with a history of CLL.  She is going to contact her hematologist oncologist about those results and also to have a discussion  about the sweats that have occurred.  Patient's had her thyroid checked by her primary care doctor and apparently it was fine.  Final Clinical Impressions(s) / ED Diagnoses   Final diagnoses:  Essential hypertension    ED Discharge Orders    None       Fredia Sorrow, MD 12/17/18 [phone]

## 2018-12-17 NOTE — ED Notes (Signed)
Denies chest pain or SOB

## 2018-12-17 NOTE — Discharge Instructions (Addendum)
Today's work-up to include labs without any significant abnormality.  But with your history of the CLL your white blood cell count today was 14,000.  And with your history of the sweats.  Recommend following up with hematologist oncologist.  Keep your blood pressure log book as we discussed.  Start the new regiment that you are primary care doctor is ordered for you.  Return for severe headache crushing chest pain difficulty breathing or any strokelike symptoms.  Ideally your blood pressure should be 140 or lower on the top number and below 90 on the bottom number.  You do not want the top number to get below 90.

## 2018-12-17 NOTE — ED Notes (Signed)
Crackers and drink given per MD.

## 2018-12-21 LAB — PATHOLOGIST SMEAR REVIEW

## 2018-12-23 DIAGNOSIS — F5101 Primary insomnia: Secondary | ICD-10-CM | POA: Diagnosis not present

## 2018-12-23 DIAGNOSIS — R61 Generalized hyperhidrosis: Secondary | ICD-10-CM | POA: Diagnosis not present

## 2018-12-23 DIAGNOSIS — I10 Essential (primary) hypertension: Secondary | ICD-10-CM | POA: Diagnosis not present

## 2018-12-28 DIAGNOSIS — R001 Bradycardia, unspecified: Secondary | ICD-10-CM | POA: Diagnosis not present

## 2018-12-31 DIAGNOSIS — I10 Essential (primary) hypertension: Secondary | ICD-10-CM | POA: Diagnosis not present

## 2019-01-26 DIAGNOSIS — R600 Localized edema: Secondary | ICD-10-CM | POA: Diagnosis not present

## 2019-01-26 DIAGNOSIS — G894 Chronic pain syndrome: Secondary | ICD-10-CM | POA: Diagnosis not present

## 2019-01-26 DIAGNOSIS — I10 Essential (primary) hypertension: Secondary | ICD-10-CM | POA: Diagnosis not present

## 2019-01-26 DIAGNOSIS — R609 Edema, unspecified: Secondary | ICD-10-CM | POA: Diagnosis not present

## 2019-03-11 DIAGNOSIS — D509 Iron deficiency anemia, unspecified: Secondary | ICD-10-CM | POA: Diagnosis not present

## 2019-03-16 DIAGNOSIS — Z79899 Other long term (current) drug therapy: Secondary | ICD-10-CM | POA: Diagnosis not present

## 2019-03-16 DIAGNOSIS — G588 Other specified mononeuropathies: Secondary | ICD-10-CM | POA: Diagnosis not present

## 2019-03-16 DIAGNOSIS — Z79891 Long term (current) use of opiate analgesic: Secondary | ICD-10-CM | POA: Diagnosis not present

## 2019-03-16 DIAGNOSIS — G894 Chronic pain syndrome: Secondary | ICD-10-CM | POA: Diagnosis not present

## 2019-03-16 DIAGNOSIS — M792 Neuralgia and neuritis, unspecified: Secondary | ICD-10-CM | POA: Diagnosis not present

## 2019-03-16 DIAGNOSIS — M5416 Radiculopathy, lumbar region: Secondary | ICD-10-CM | POA: Diagnosis not present

## 2019-04-05 DIAGNOSIS — R0600 Dyspnea, unspecified: Secondary | ICD-10-CM | POA: Diagnosis not present

## 2019-04-05 DIAGNOSIS — R600 Localized edema: Secondary | ICD-10-CM | POA: Diagnosis not present

## 2019-04-05 DIAGNOSIS — I10 Essential (primary) hypertension: Secondary | ICD-10-CM | POA: Diagnosis not present

## 2019-04-05 DIAGNOSIS — E785 Hyperlipidemia, unspecified: Secondary | ICD-10-CM | POA: Diagnosis not present

## 2019-04-05 DIAGNOSIS — I2510 Atherosclerotic heart disease of native coronary artery without angina pectoris: Secondary | ICD-10-CM | POA: Diagnosis not present

## 2019-04-05 DIAGNOSIS — D509 Iron deficiency anemia, unspecified: Secondary | ICD-10-CM | POA: Diagnosis not present

## 2019-04-05 DIAGNOSIS — R000 Tachycardia, unspecified: Secondary | ICD-10-CM | POA: Diagnosis not present

## 2019-04-05 DIAGNOSIS — E039 Hypothyroidism, unspecified: Secondary | ICD-10-CM | POA: Diagnosis not present

## 2019-04-06 DIAGNOSIS — R000 Tachycardia, unspecified: Secondary | ICD-10-CM | POA: Diagnosis not present

## 2019-04-12 DIAGNOSIS — Z79899 Other long term (current) drug therapy: Secondary | ICD-10-CM | POA: Diagnosis not present

## 2019-04-12 DIAGNOSIS — M25551 Pain in right hip: Secondary | ICD-10-CM | POA: Diagnosis not present

## 2019-04-12 DIAGNOSIS — M792 Neuralgia and neuritis, unspecified: Secondary | ICD-10-CM | POA: Diagnosis not present

## 2019-04-12 DIAGNOSIS — M7918 Myalgia, other site: Secondary | ICD-10-CM | POA: Diagnosis not present

## 2019-04-13 DIAGNOSIS — Z79899 Other long term (current) drug therapy: Secondary | ICD-10-CM | POA: Diagnosis not present

## 2019-04-13 DIAGNOSIS — D72820 Lymphocytosis (symptomatic): Secondary | ICD-10-CM | POA: Diagnosis not present

## 2019-04-13 DIAGNOSIS — D508 Other iron deficiency anemias: Secondary | ICD-10-CM | POA: Diagnosis not present

## 2019-04-13 DIAGNOSIS — Z9884 Bariatric surgery status: Secondary | ICD-10-CM | POA: Diagnosis not present

## 2019-04-13 DIAGNOSIS — D509 Iron deficiency anemia, unspecified: Secondary | ICD-10-CM | POA: Diagnosis not present

## 2019-04-13 DIAGNOSIS — C9110 Chronic lymphocytic leukemia of B-cell type not having achieved remission: Secondary | ICD-10-CM | POA: Diagnosis not present

## 2019-06-13 DIAGNOSIS — M5416 Radiculopathy, lumbar region: Secondary | ICD-10-CM | POA: Diagnosis not present

## 2019-06-13 DIAGNOSIS — G894 Chronic pain syndrome: Secondary | ICD-10-CM | POA: Diagnosis not present

## 2019-06-13 DIAGNOSIS — M792 Neuralgia and neuritis, unspecified: Secondary | ICD-10-CM | POA: Diagnosis not present

## 2019-06-13 DIAGNOSIS — Z79891 Long term (current) use of opiate analgesic: Secondary | ICD-10-CM | POA: Diagnosis not present

## 2019-06-13 DIAGNOSIS — G588 Other specified mononeuropathies: Secondary | ICD-10-CM | POA: Diagnosis not present

## 2019-06-13 DIAGNOSIS — M25551 Pain in right hip: Secondary | ICD-10-CM | POA: Diagnosis not present

## 2019-06-20 DIAGNOSIS — M5416 Radiculopathy, lumbar region: Secondary | ICD-10-CM | POA: Diagnosis not present

## 2019-08-08 DIAGNOSIS — C9110 Chronic lymphocytic leukemia of B-cell type not having achieved remission: Secondary | ICD-10-CM | POA: Diagnosis not present

## 2019-08-08 DIAGNOSIS — D509 Iron deficiency anemia, unspecified: Secondary | ICD-10-CM | POA: Diagnosis not present

## 2019-08-08 DIAGNOSIS — Z9884 Bariatric surgery status: Secondary | ICD-10-CM | POA: Diagnosis not present

## 2019-08-08 DIAGNOSIS — G894 Chronic pain syndrome: Secondary | ICD-10-CM | POA: Diagnosis not present

## 2019-08-08 DIAGNOSIS — D72820 Lymphocytosis (symptomatic): Secondary | ICD-10-CM | POA: Diagnosis not present

## 2019-08-09 DIAGNOSIS — M792 Neuralgia and neuritis, unspecified: Secondary | ICD-10-CM | POA: Diagnosis not present

## 2019-08-09 DIAGNOSIS — M25551 Pain in right hip: Secondary | ICD-10-CM | POA: Diagnosis not present

## 2019-08-09 DIAGNOSIS — M5416 Radiculopathy, lumbar region: Secondary | ICD-10-CM | POA: Diagnosis not present

## 2019-08-09 DIAGNOSIS — G894 Chronic pain syndrome: Secondary | ICD-10-CM | POA: Diagnosis not present

## 2019-08-09 DIAGNOSIS — Z79899 Other long term (current) drug therapy: Secondary | ICD-10-CM | POA: Diagnosis not present

## 2019-08-09 DIAGNOSIS — Z79891 Long term (current) use of opiate analgesic: Secondary | ICD-10-CM | POA: Diagnosis not present

## 2019-09-12 DIAGNOSIS — H40053 Ocular hypertension, bilateral: Secondary | ICD-10-CM | POA: Diagnosis not present

## 2019-09-12 DIAGNOSIS — H18513 Endothelial corneal dystrophy, bilateral: Secondary | ICD-10-CM | POA: Diagnosis not present

## 2019-09-12 DIAGNOSIS — Z961 Presence of intraocular lens: Secondary | ICD-10-CM | POA: Diagnosis not present

## 2019-09-13 DIAGNOSIS — G894 Chronic pain syndrome: Secondary | ICD-10-CM | POA: Diagnosis not present

## 2019-09-13 DIAGNOSIS — Z79899 Other long term (current) drug therapy: Secondary | ICD-10-CM | POA: Diagnosis not present

## 2019-09-13 DIAGNOSIS — M810 Age-related osteoporosis without current pathological fracture: Secondary | ICD-10-CM | POA: Diagnosis not present

## 2019-09-13 DIAGNOSIS — M25551 Pain in right hip: Secondary | ICD-10-CM | POA: Diagnosis not present

## 2019-09-13 DIAGNOSIS — E559 Vitamin D deficiency, unspecified: Secondary | ICD-10-CM | POA: Diagnosis not present

## 2019-09-13 DIAGNOSIS — E039 Hypothyroidism, unspecified: Secondary | ICD-10-CM | POA: Diagnosis not present

## 2019-09-13 DIAGNOSIS — M5416 Radiculopathy, lumbar region: Secondary | ICD-10-CM | POA: Diagnosis not present

## 2019-09-13 DIAGNOSIS — M792 Neuralgia and neuritis, unspecified: Secondary | ICD-10-CM | POA: Diagnosis not present

## 2019-09-20 DIAGNOSIS — M810 Age-related osteoporosis without current pathological fracture: Secondary | ICD-10-CM | POA: Diagnosis not present

## 2019-09-30 DIAGNOSIS — R635 Abnormal weight gain: Secondary | ICD-10-CM | POA: Diagnosis not present

## 2019-09-30 DIAGNOSIS — J439 Emphysema, unspecified: Secondary | ICD-10-CM | POA: Diagnosis not present

## 2019-09-30 DIAGNOSIS — I700 Atherosclerosis of aorta: Secondary | ICD-10-CM | POA: Diagnosis not present

## 2019-09-30 DIAGNOSIS — R600 Localized edema: Secondary | ICD-10-CM | POA: Diagnosis not present

## 2019-09-30 DIAGNOSIS — R21 Rash and other nonspecific skin eruption: Secondary | ICD-10-CM | POA: Diagnosis not present

## 2019-09-30 DIAGNOSIS — I517 Cardiomegaly: Secondary | ICD-10-CM | POA: Diagnosis not present

## 2019-10-11 DIAGNOSIS — G894 Chronic pain syndrome: Secondary | ICD-10-CM | POA: Diagnosis not present

## 2019-10-11 DIAGNOSIS — M792 Neuralgia and neuritis, unspecified: Secondary | ICD-10-CM | POA: Diagnosis not present

## 2019-10-11 DIAGNOSIS — M5416 Radiculopathy, lumbar region: Secondary | ICD-10-CM | POA: Diagnosis not present

## 2019-10-11 DIAGNOSIS — Z79891 Long term (current) use of opiate analgesic: Secondary | ICD-10-CM | POA: Diagnosis not present

## 2019-10-11 DIAGNOSIS — G588 Other specified mononeuropathies: Secondary | ICD-10-CM | POA: Diagnosis not present

## 2019-10-11 DIAGNOSIS — M48061 Spinal stenosis, lumbar region without neurogenic claudication: Secondary | ICD-10-CM | POA: Diagnosis not present

## 2019-10-20 DIAGNOSIS — R609 Edema, unspecified: Secondary | ICD-10-CM | POA: Diagnosis not present

## 2019-10-20 DIAGNOSIS — R8290 Unspecified abnormal findings in urine: Secondary | ICD-10-CM | POA: Diagnosis not present

## 2019-10-20 DIAGNOSIS — Z23 Encounter for immunization: Secondary | ICD-10-CM | POA: Diagnosis not present

## 2019-11-29 DIAGNOSIS — E039 Hypothyroidism, unspecified: Secondary | ICD-10-CM | POA: Diagnosis not present

## 2019-11-29 DIAGNOSIS — E559 Vitamin D deficiency, unspecified: Secondary | ICD-10-CM | POA: Diagnosis not present

## 2019-11-29 DIAGNOSIS — M810 Age-related osteoporosis without current pathological fracture: Secondary | ICD-10-CM | POA: Diagnosis not present

## 2019-11-29 DIAGNOSIS — R5383 Other fatigue: Secondary | ICD-10-CM | POA: Diagnosis not present

## 2019-12-21 DIAGNOSIS — D509 Iron deficiency anemia, unspecified: Secondary | ICD-10-CM | POA: Diagnosis not present

## 2019-12-21 DIAGNOSIS — G894 Chronic pain syndrome: Secondary | ICD-10-CM | POA: Diagnosis not present

## 2019-12-21 DIAGNOSIS — R5383 Other fatigue: Secondary | ICD-10-CM | POA: Diagnosis not present

## 2019-12-21 DIAGNOSIS — C9110 Chronic lymphocytic leukemia of B-cell type not having achieved remission: Secondary | ICD-10-CM | POA: Diagnosis not present

## 2019-12-21 DIAGNOSIS — Z79899 Other long term (current) drug therapy: Secondary | ICD-10-CM | POA: Diagnosis not present

## 2019-12-21 DIAGNOSIS — Z9884 Bariatric surgery status: Secondary | ICD-10-CM | POA: Diagnosis not present

## 2019-12-21 DIAGNOSIS — R2242 Localized swelling, mass and lump, left lower limb: Secondary | ICD-10-CM | POA: Diagnosis not present

## 2020-01-10 DIAGNOSIS — G894 Chronic pain syndrome: Secondary | ICD-10-CM | POA: Diagnosis not present

## 2020-01-10 DIAGNOSIS — M5416 Radiculopathy, lumbar region: Secondary | ICD-10-CM | POA: Diagnosis not present

## 2020-01-19 DIAGNOSIS — F5101 Primary insomnia: Secondary | ICD-10-CM | POA: Diagnosis not present

## 2020-01-19 DIAGNOSIS — F419 Anxiety disorder, unspecified: Secondary | ICD-10-CM | POA: Diagnosis not present

## 2020-01-19 DIAGNOSIS — I10 Essential (primary) hypertension: Secondary | ICD-10-CM | POA: Diagnosis not present

## 2020-02-08 DIAGNOSIS — G894 Chronic pain syndrome: Secondary | ICD-10-CM | POA: Diagnosis not present

## 2020-02-08 DIAGNOSIS — M5416 Radiculopathy, lumbar region: Secondary | ICD-10-CM | POA: Diagnosis not present

## 2020-03-20 DIAGNOSIS — M85852 Other specified disorders of bone density and structure, left thigh: Secondary | ICD-10-CM | POA: Diagnosis not present

## 2020-03-20 DIAGNOSIS — E039 Hypothyroidism, unspecified: Secondary | ICD-10-CM | POA: Diagnosis not present

## 2020-03-20 DIAGNOSIS — E559 Vitamin D deficiency, unspecified: Secondary | ICD-10-CM | POA: Diagnosis not present

## 2020-03-20 DIAGNOSIS — M810 Age-related osteoporosis without current pathological fracture: Secondary | ICD-10-CM | POA: Diagnosis not present

## 2020-04-10 DIAGNOSIS — G894 Chronic pain syndrome: Secondary | ICD-10-CM | POA: Diagnosis not present

## 2020-04-10 DIAGNOSIS — D509 Iron deficiency anemia, unspecified: Secondary | ICD-10-CM | POA: Diagnosis not present

## 2020-04-10 DIAGNOSIS — R2242 Localized swelling, mass and lump, left lower limb: Secondary | ICD-10-CM | POA: Diagnosis not present

## 2020-04-10 DIAGNOSIS — C9110 Chronic lymphocytic leukemia of B-cell type not having achieved remission: Secondary | ICD-10-CM | POA: Diagnosis not present

## 2020-04-10 DIAGNOSIS — Z79899 Other long term (current) drug therapy: Secondary | ICD-10-CM | POA: Diagnosis not present

## 2020-04-18 DIAGNOSIS — I10 Essential (primary) hypertension: Secondary | ICD-10-CM | POA: Diagnosis not present

## 2020-04-18 DIAGNOSIS — Z0000 Encounter for general adult medical examination without abnormal findings: Secondary | ICD-10-CM | POA: Diagnosis not present

## 2020-04-18 DIAGNOSIS — M810 Age-related osteoporosis without current pathological fracture: Secondary | ICD-10-CM | POA: Diagnosis not present

## 2020-04-18 DIAGNOSIS — F5101 Primary insomnia: Secondary | ICD-10-CM | POA: Diagnosis not present

## 2020-04-18 DIAGNOSIS — N2581 Secondary hyperparathyroidism of renal origin: Secondary | ICD-10-CM | POA: Diagnosis not present

## 2020-04-19 DIAGNOSIS — C9110 Chronic lymphocytic leukemia of B-cell type not having achieved remission: Secondary | ICD-10-CM | POA: Diagnosis not present

## 2020-04-19 DIAGNOSIS — R2242 Localized swelling, mass and lump, left lower limb: Secondary | ICD-10-CM | POA: Diagnosis not present

## 2020-05-10 DIAGNOSIS — I700 Atherosclerosis of aorta: Secondary | ICD-10-CM | POA: Diagnosis not present

## 2020-05-10 DIAGNOSIS — I2510 Atherosclerotic heart disease of native coronary artery without angina pectoris: Secondary | ICD-10-CM | POA: Diagnosis not present

## 2020-05-10 DIAGNOSIS — Z9013 Acquired absence of bilateral breasts and nipples: Secondary | ICD-10-CM | POA: Diagnosis not present

## 2020-05-10 DIAGNOSIS — K8020 Calculus of gallbladder without cholecystitis without obstruction: Secondary | ICD-10-CM | POA: Diagnosis not present

## 2020-05-10 DIAGNOSIS — Z9884 Bariatric surgery status: Secondary | ICD-10-CM | POA: Diagnosis not present

## 2020-05-10 DIAGNOSIS — R221 Localized swelling, mass and lump, neck: Secondary | ICD-10-CM | POA: Diagnosis not present

## 2020-05-10 DIAGNOSIS — N200 Calculus of kidney: Secondary | ICD-10-CM | POA: Diagnosis not present

## 2020-05-10 DIAGNOSIS — C9110 Chronic lymphocytic leukemia of B-cell type not having achieved remission: Secondary | ICD-10-CM | POA: Diagnosis not present

## 2020-05-10 DIAGNOSIS — I517 Cardiomegaly: Secondary | ICD-10-CM | POA: Diagnosis not present

## 2020-05-10 DIAGNOSIS — D72820 Lymphocytosis (symptomatic): Secondary | ICD-10-CM | POA: Diagnosis not present

## 2020-05-14 DIAGNOSIS — R2242 Localized swelling, mass and lump, left lower limb: Secondary | ICD-10-CM | POA: Diagnosis not present

## 2020-05-14 DIAGNOSIS — G894 Chronic pain syndrome: Secondary | ICD-10-CM | POA: Diagnosis not present

## 2020-05-14 DIAGNOSIS — R221 Localized swelling, mass and lump, neck: Secondary | ICD-10-CM | POA: Diagnosis not present

## 2020-05-14 DIAGNOSIS — D509 Iron deficiency anemia, unspecified: Secondary | ICD-10-CM | POA: Diagnosis not present

## 2020-05-14 DIAGNOSIS — Z79899 Other long term (current) drug therapy: Secondary | ICD-10-CM | POA: Diagnosis not present

## 2020-05-14 DIAGNOSIS — C9110 Chronic lymphocytic leukemia of B-cell type not having achieved remission: Secondary | ICD-10-CM | POA: Diagnosis not present

## 2020-05-14 DIAGNOSIS — D508 Other iron deficiency anemias: Secondary | ICD-10-CM | POA: Diagnosis not present

## 2020-05-14 DIAGNOSIS — D446 Neoplasm of uncertain behavior of carotid body: Secondary | ICD-10-CM | POA: Diagnosis not present

## 2020-05-14 DIAGNOSIS — Z9884 Bariatric surgery status: Secondary | ICD-10-CM | POA: Diagnosis not present

## 2020-06-08 DIAGNOSIS — D446 Neoplasm of uncertain behavior of carotid body: Secondary | ICD-10-CM | POA: Diagnosis not present

## 2020-06-08 DIAGNOSIS — I6521 Occlusion and stenosis of right carotid artery: Secondary | ICD-10-CM | POA: Diagnosis not present

## 2021-04-20 ENCOUNTER — Emergency Department (HOSPITAL_BASED_OUTPATIENT_CLINIC_OR_DEPARTMENT_OTHER): Payer: Medicare Other

## 2021-04-20 ENCOUNTER — Other Ambulatory Visit: Payer: Self-pay

## 2021-04-20 ENCOUNTER — Emergency Department (HOSPITAL_BASED_OUTPATIENT_CLINIC_OR_DEPARTMENT_OTHER)
Admission: EM | Admit: 2021-04-20 | Discharge: 2021-04-20 | Disposition: A | Discharge status: Home / Self Care | Payer: Medicare Other | Attending: Emergency Medicine | Admitting: Emergency Medicine

## 2021-04-20 ENCOUNTER — Encounter (HOSPITAL_BASED_OUTPATIENT_CLINIC_OR_DEPARTMENT_OTHER): Payer: Self-pay | Admitting: Emergency Medicine

## 2021-04-20 DIAGNOSIS — W19XXXA Unspecified fall, initial encounter: Secondary | ICD-10-CM

## 2021-04-20 DIAGNOSIS — Z79899 Other long term (current) drug therapy: Secondary | ICD-10-CM | POA: Insufficient documentation

## 2021-04-20 DIAGNOSIS — S40011A Contusion of right shoulder, initial encounter: Secondary | ICD-10-CM | POA: Insufficient documentation

## 2021-04-20 DIAGNOSIS — S4991XA Unspecified injury of right shoulder and upper arm, initial encounter: Secondary | ICD-10-CM | POA: Diagnosis present

## 2021-04-20 DIAGNOSIS — S8001XA Contusion of right knee, initial encounter: Secondary | ICD-10-CM | POA: Insufficient documentation

## 2021-04-20 DIAGNOSIS — M79644 Pain in right finger(s): Secondary | ICD-10-CM | POA: Diagnosis not present

## 2021-04-20 DIAGNOSIS — W06XXXA Fall from bed, initial encounter: Secondary | ICD-10-CM | POA: Insufficient documentation

## 2021-04-20 DIAGNOSIS — Z7982 Long term (current) use of aspirin: Secondary | ICD-10-CM | POA: Diagnosis not present

## 2021-04-20 DIAGNOSIS — M25521 Pain in right elbow: Secondary | ICD-10-CM | POA: Diagnosis not present

## 2021-04-20 MED ORDER — HYDROCODONE-ACETAMINOPHEN 5-325 MG PO TABS
1.0000 | ORAL_TABLET | Freq: Once | ORAL | Status: AC
  Administered 2021-04-20: 1 via ORAL
  Filled 2021-04-20: qty 1

## 2021-04-20 NOTE — ED Notes (Signed)
This RN went into the room to give the patient her d/c paperwork and to review it with her but the patient was not in her room. This RN confirmed with registration that the patient had left the ED prior to receiving her d/c paperwork.  ?

## 2021-04-20 NOTE — ED Provider Notes (Signed)
[place] EMERGENCY DEPARTMENT Provider Note   CSN: [id number] Arrival date & time: 04/20/21  1453     History  Chief Complaint  Patient presents with   Samantha Harper    Samantha Harper is a 81 y.o. female.  Patient is a 81 year old female who presents after a fall.  She said she fell out of bed last night.  She reports that she was laying in bed and felt like her right leg was asleep.  She laid there for a little bit to try to get it to wake up.  When she stood up, it buckled and she fell to the ground.  She injured her knees as well as her right shoulder.  She says she did not hit her head.  There is no loss of consciousness.  She said it felt like it was asleep for about 10 minutes but now feels back to normal and has felt back to normal since that time of the fall.  She denies any weakness in the leg.  She says it was hard to move but more because it hurt rather than weakness.  She has no arm involvement.  No slurred speech or vision changes.  She denies any other injuries.  She is not on anticoagulants.      Home Medications Prior to Admission medications   Medication Sig Start Date End Date Taking? Authorizing Provider  aspirin EC 81 MG tablet Take 81 mg by mouth daily.    [provider]  citalopram (CELEXA) 10 MG tablet Take 10 mg by mouth daily.    [provider]  DULoxetine (CYMBALTA) 30 MG capsule Take by mouth. 10/05/18   [provider]  estrogens, conjugated, (PREMARIN) 0.625 MG tablet  05/25/09   [provider]  fentaNYL (DURAGESIC - DOSED MCG/HR) 25 MCG/HR patch Place 25 mcg onto the skin every 3 (three) days.    [provider]  gabapentin (NEURONTIN) 600 MG tablet Take 600 mg by mouth 2 (two) times daily.    [provider]  levothyroxine (SYNTHROID, LEVOTHROID) 112 MCG tablet Take 112 mcg by mouth daily before breakfast.    [provider]  lidocaine (LIDODERM) 5 % Apply patch to painful area. Patch  may remain in place for up to 12 hours in a 24 hour period. 05/25/18   [provider]  losartan (COZAAR) 50 MG tablet Take 50 mg by mouth daily. 12/04/18   [provider]  meclizine (ANTIVERT) 12.5 MG tablet TAKE 1 TABLET(12.5 MG) BY MOUTH THREE TIMES DAILY FOR UP TO 10 DAYS AS NEEDED 09/30/17   [provider]  metoprolol tartrate (LOPRESSOR) 25 MG tablet Take 25 mg by mouth 2 (two) times daily. 12/13/18   [provider]  pregabalin (LYRICA) 75 MG capsule Take by mouth. 10/05/18   [provider]  telmisartan (MICARDIS) 20 MG tablet Take 20 mg by mouth daily.    [provider]  tiZANidine (ZANAFLEX) 4 MG tablet  12/04/18   [provider]  traZODone (DESYREL) 100 MG tablet Take 100 mg by mouth at bedtime.    [provider]      Allergies    Codeine, Meperidine, Oxycodone-acetaminophen, Buprenorphine, and Morphine    Review of Systems   Review of Systems  Constitutional:  Negative for fever.  Respiratory:  Negative for shortness of breath.   Cardiovascular:  Negative for chest pain.  Gastrointestinal:  Negative for nausea and vomiting.  Musculoskeletal:  Positive for arthralgias. Negative  for back pain, joint swelling and neck pain.  Skin:  Negative for wound.  Neurological:  Negative for weakness, numbness and headaches.   Physical Exam Updated Vital Signs BP 129/64    Pulse 70    Temp 98.2 F (36.8 C) (Oral)    Resp 18    Ht '5\' 2"'$  (1.575 m)    Wt 91.6 kg    SpO2 94%    BMI 36.95 kg/m  Physical Exam Constitutional:      Appearance: She is well-developed.  HENT:     Head: Normocephalic and atraumatic.  Eyes:     Pupils: Pupils are equal, round, and reactive to light.  Neck:     Comments: No pain along the spine. Cardiovascular:     Rate and Rhythm: Normal rate and regular rhythm.     Heart sounds: Normal heart sounds.  Pulmonary:     Effort: Pulmonary effort is normal. No respiratory distress.      Breath sounds: Normal breath sounds. No wheezing or rales.  Chest:     Chest wall: No tenderness.  Abdominal:     General: Bowel sounds are normal.     Palpations: Abdomen is soft.     Tenderness: There is no abdominal tenderness. There is no guarding or rebound.  Musculoskeletal:     Cervical back: Normal range of motion and neck supple.     Comments: Positive tenderness on palpation range of motion of the right shoulder.  There is also some tenderness to the right elbow and right thumb.  No deformity or swelling is noted.  No external trauma is noted.  Radial pulses are intact.  She has normal sensation and motor function distally.  She does have some pain and ecchymosis over anterior right knee.  No deformity or significant swelling is noted.  There is no pain to the ankle or lower leg.  No pain on range of motion of the hip.  No pain on range of motion of the left leg.  There are some mild discomfort on palpation of the anterior left knee.  Pedal pulses are intact.  She has normal sensation and motor function distally.  Lymphadenopathy:     Cervical: No cervical adenopathy.  Skin:    General: Skin is warm and dry.     Findings: No rash.  Neurological:     General: No focal deficit present.     Mental Status: She is alert and oriented to person, place, and time.     Comments: Motor 5 out of 5 all extremities, sensation grossly intact to light touch all extremities, no slurred speech, cranial nerves II through XII grossly intact    ED Results / Procedures / Treatments   Labs (all labs ordered are listed, but only abnormal results are displayed) Labs Reviewed - No data to display  EKG None  Radiology DG Shoulder Right  Result Date: 04/20/2021 CLINICAL DATA:  Acute RIGHT shoulder pain following fall. Initial encounter. EXAM: RIGHT SHOULDER - 2+ VIEW COMPARISON:  None. FINDINGS: There is no evidence of fracture or dislocation. There is no evidence of arthropathy or other focal bone  abnormality. Soft tissues are unremarkable. IMPRESSION: Negative. Electronically Signed   By: Margarette Canada M.D.   On: 04/20/2021 15:57   DG Elbow Complete Right  Result Date: 04/20/2021 CLINICAL DATA:  [place] out of bed last night, RIGHT elbow pain EXAM: RIGHT ELBOW - COMPLETE 3+ VIEW COMPARISON:  None FINDINGS: Mild osseous demineralization. Joint spaces preserved. No acute  fracture, dislocation, or bone destruction. No joint effusion. IMPRESSION: No acute abnormalities. Electronically Signed   By: Lavonia Dana M.D.   On: 04/20/2021 16:57   DG Knee Complete 4 Views Left  Result Date: 04/20/2021 CLINICAL DATA:  Acute LEFT knee pain following fall. Initial encounter. EXAM: LEFT KNEE - COMPLETE 4+ VIEW COMPARISON:  None. FINDINGS: No acute fracture or dislocation. No joint effusion. Mild to moderate tricompartmental joint space narrowing noted. No focal bony lesions are identified. IMPRESSION: 1. No evidence of acute abnormality. 2. Mild to moderate tricompartmental degenerative changes. Electronically Signed   By: Margarette Canada M.D.   On: 04/20/2021 16:00   DG Knee Complete 4 Views Right  Result Date: 04/20/2021 CLINICAL DATA:  Acute RIGHT knee pain following fall. Initial encounter. EXAM: RIGHT KNEE - COMPLETE 4+ VIEW COMPARISON:  None. FINDINGS: No acute fracture or dislocation. No joint effusion is noted. Moderate tricompartmental joint space narrowing noted. IMPRESSION: 1. No evidence of acute abnormality. 2. Moderate tricompartmental degenerative changes. Electronically Signed   By: Margarette Canada M.D.   On: 04/20/2021 15:58   DG Hand Complete Right  Result Date: 04/20/2021 CLINICAL DATA:  Fall, right hand pain EXAM: RIGHT HAND - COMPLETE 3+ VIEW COMPARISON:  None. FINDINGS: There is no evidence of fracture or dislocation. Severe osteoarthritis of the first [facility] joint. Moderate arthropathy of the distal interphalangeal joints of the hand. Soft tissues are unremarkable. IMPRESSION: Negative. Electronically  Signed   By: Davina Poke D.O.   On: 04/20/2021 16:58   DG Hip Unilat W or Wo Pelvis 2-3 Views Right  Result Date: 04/20/2021 CLINICAL DATA:  Acute RIGHT hip pain following fall. Initial encounter. EXAM: DG HIP (WITH OR WITHOUT PELVIS) 2-3V RIGHT COMPARISON:  None. FINDINGS: There is no evidence of hip fracture or dislocation. There is no evidence of arthropathy or other focal bone abnormality. IMPRESSION: Negative. Electronically Signed   By: Margarette Canada M.D.   On: 04/20/2021 16:01    Procedures Procedures    Medications Ordered in ED Medications  HYDROcodone-acetaminophen (NORCO/VICODIN) 5-325 MG per tablet 1 tablet (1 tablet Oral Given 04/20/21 1702)    ED Course/ Medical Decision Making/ A&P                           Medical Decision Making Amount and/or Complexity of Data Reviewed Radiology: ordered.  Risk Prescription drug management.   Patient is a 81 year old female who presents after a mechanical fall.  She did say that her leg had fallen asleep and that is what led to the fall.  She said it only lasted about 10 minutes and she did not have any evident weakness to the leg.  No other findings which would be more suggestive of a stroke.  She does not have any neurologic deficits currently.  No associated back pain.  She had x-rays of her right shoulder, right elbow, right hand and right hip.  As well as both of her knees.  These were interpreted by me to show no acute fractures or other abnormalities.  She was given a dose of hydrocodone which she takes at home for pain.  She is not on anticoagulants and had no suggestions of a head injury.  She was discharged home in good condition.  She was encouraged to follow-up with her orthopedist.  Return precautions were given.  Final Clinical Impression(s) / ED Diagnoses Final diagnoses:  Fall, initial encounter  Contusion of right shoulder, initial encounter  Contusion of right knee, initial encounter    Rx / DC Orders ED  Discharge Orders     None         Malvin Johns, MD 04/20/21 1721

## 2021-04-20 NOTE — ED Triage Notes (Signed)
Pt arrives pov, to triage in wheelchair, reports fall out of bed last night. Pt c/o right and left knee pain, right hip pain and right shoulder pain. Pt denies loc, denies thinners. ?

## 2023-05-24 IMAGING — DX DG KNEE COMPLETE 4+V*L*
4 series · 4 of 4 positions shown · non-contrast
Comparison: None.

CLINICAL DATA: Acute LEFT knee pain following fall. Initial
encounter.

EXAM:
LEFT KNEE - COMPLETE 4+ VIEW

[knee ap]
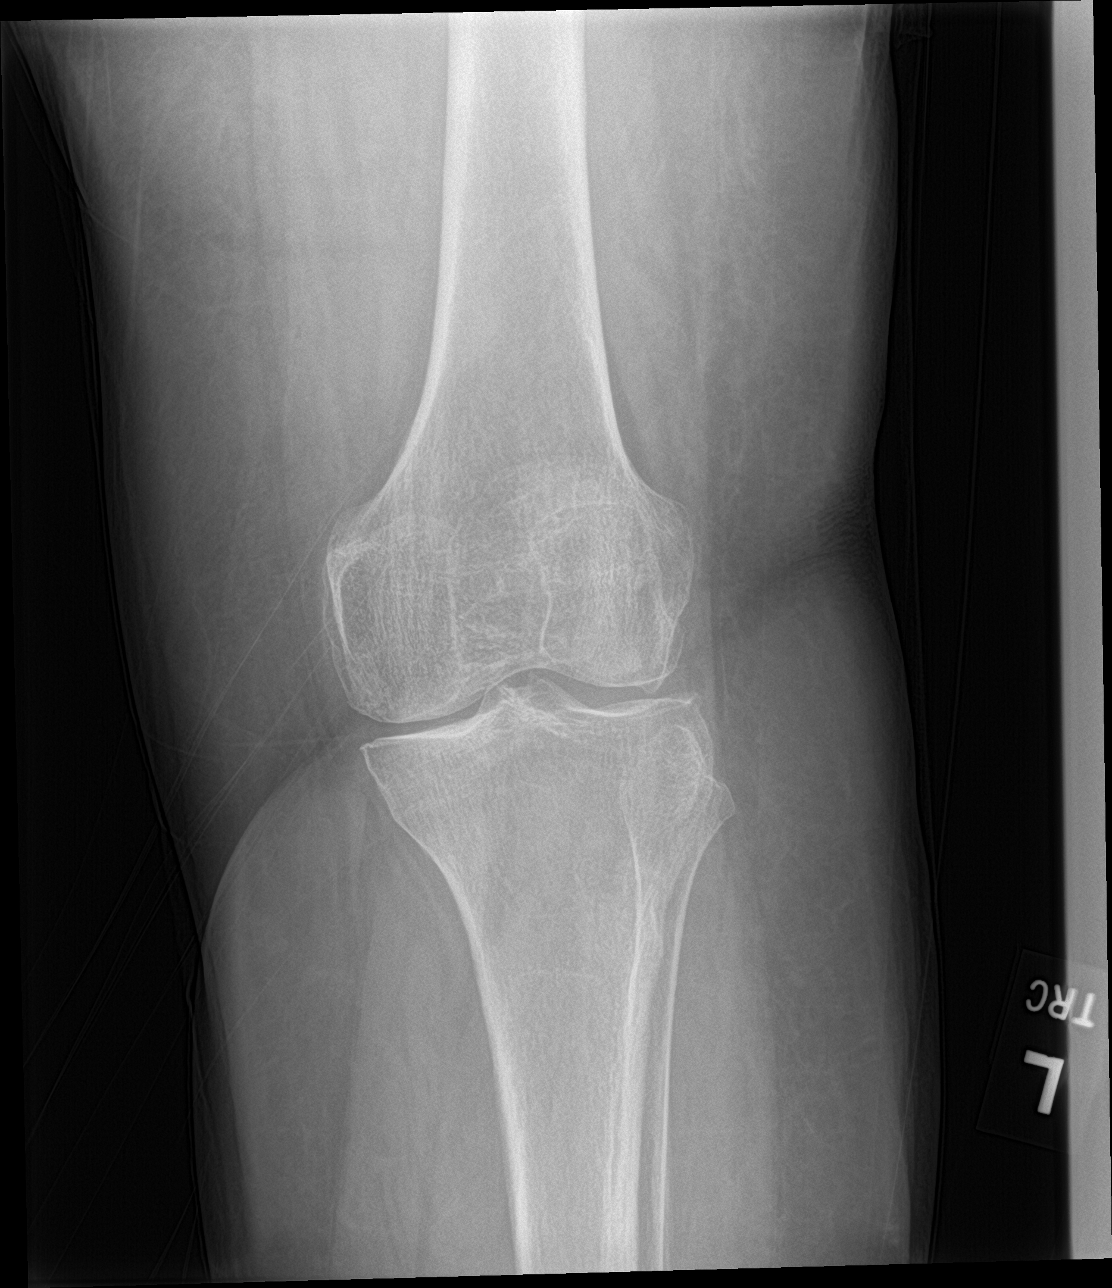

[knee obl (1 of 2)]
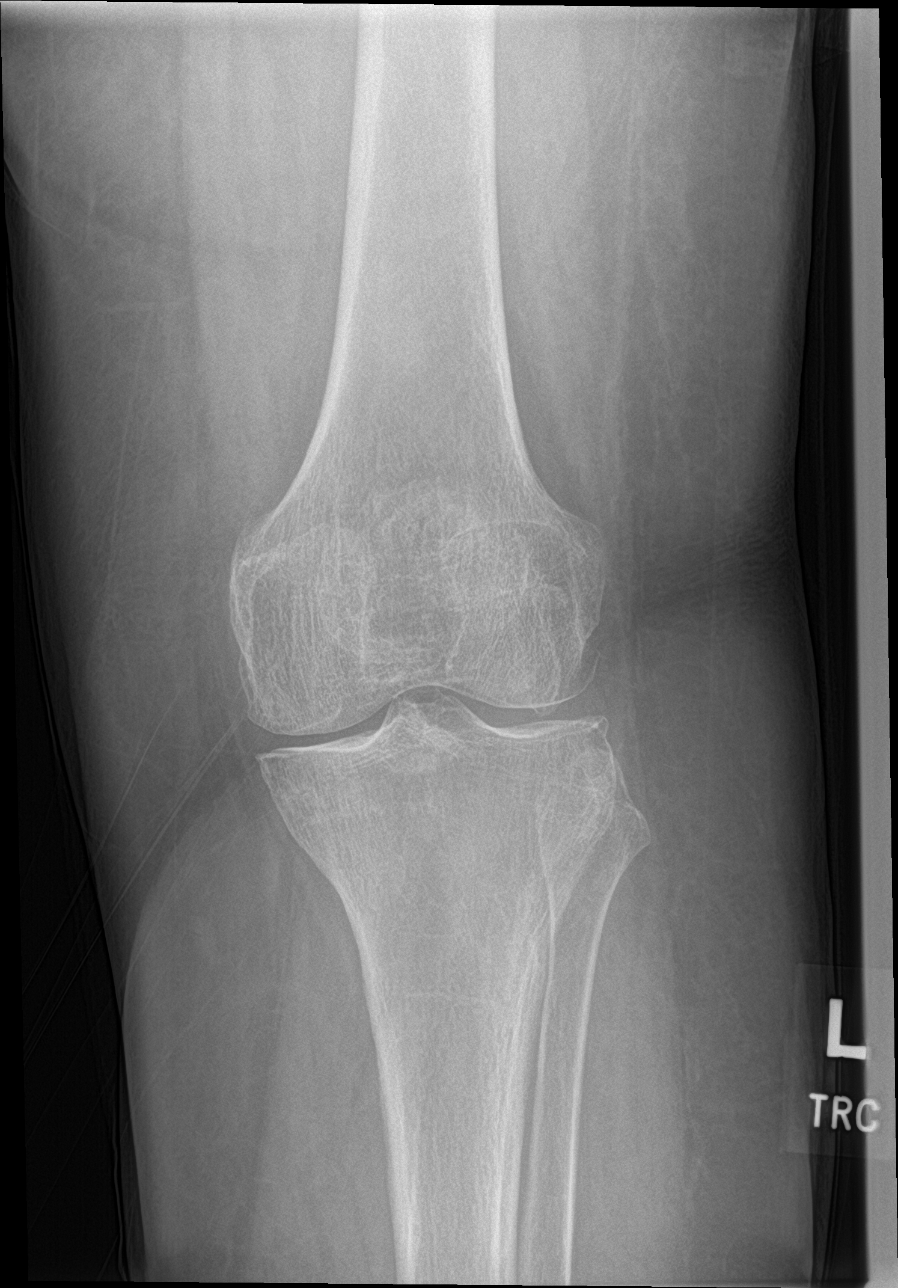

[knee obl (2 of 2)]
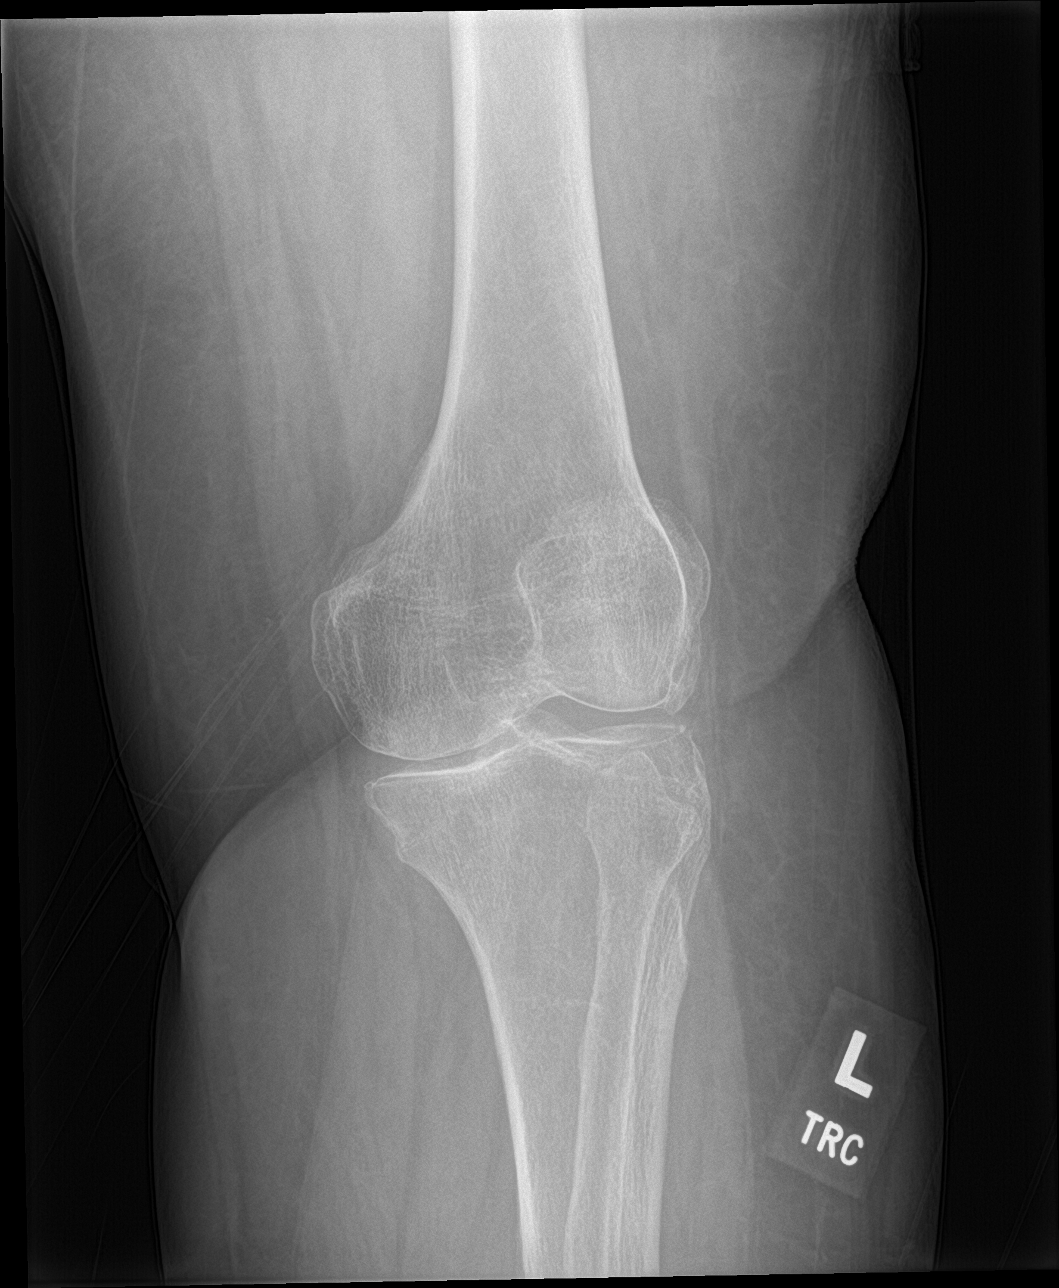

[knee lat]
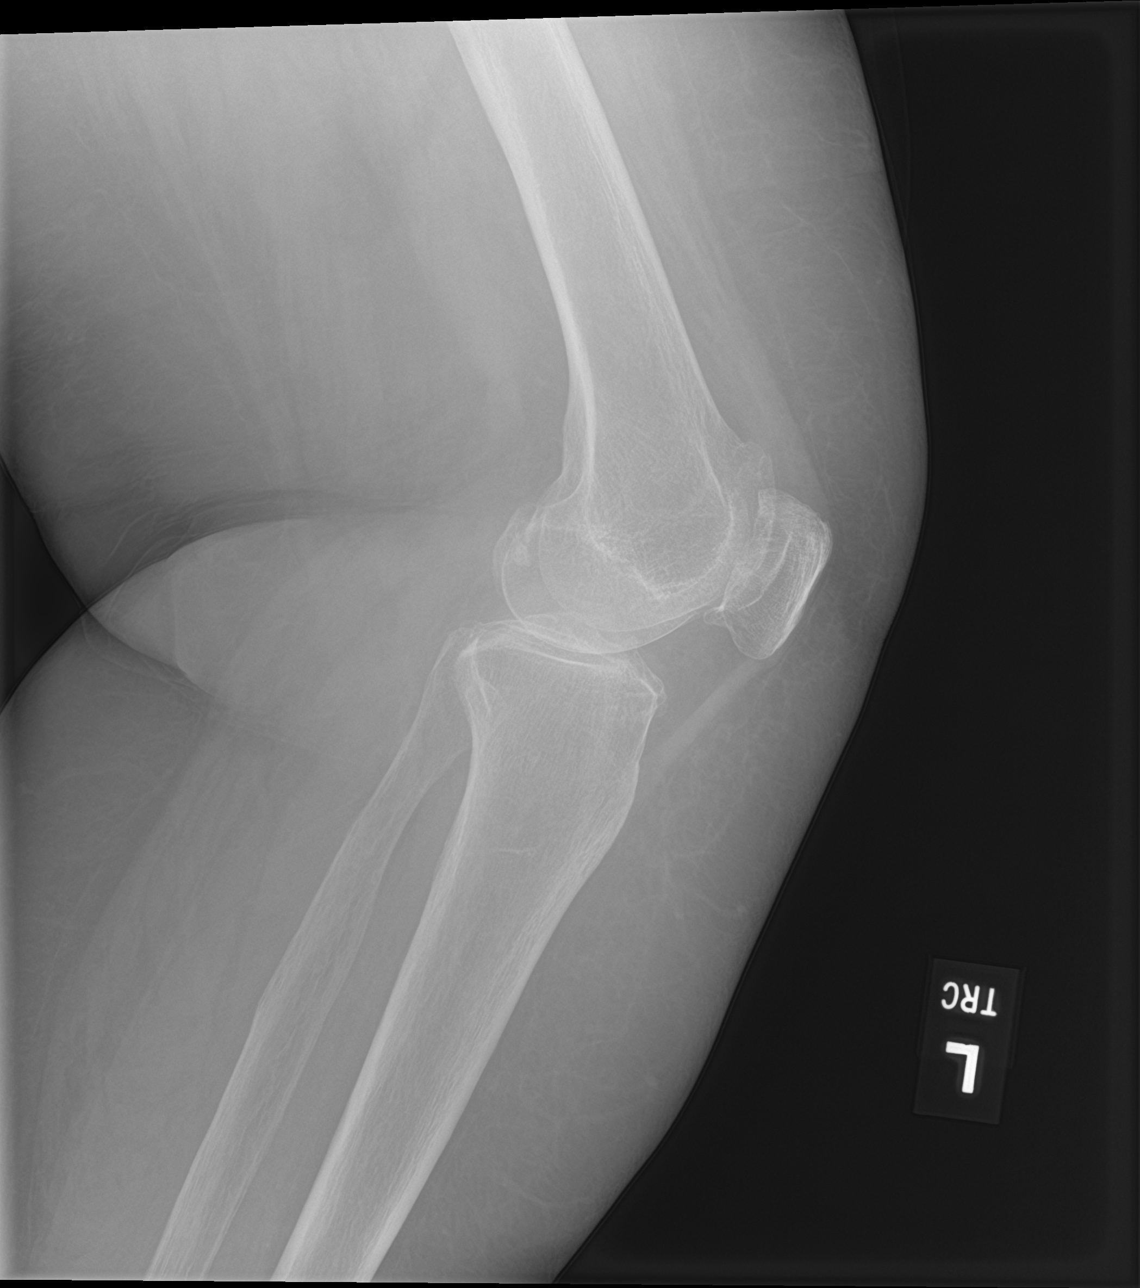

[4 of 4 positions shown; findings below may reference images not displayed]

FINDINGS: No acute fracture or dislocation.

No joint effusion.

Mild to moderate tricompartmental joint space narrowing noted.

No focal bony lesions are identified.
IMPRESSION: 1. No evidence of acute abnormality.
2. Mild to moderate tricompartmental degenerative changes.

## 2023-05-24 IMAGING — DX DG HIP (WITH OR WITHOUT PELVIS) 2-3V*R*
3 series · 3 of 3 positions shown · non-contrast
Comparison: None.

CLINICAL DATA: Acute RIGHT hip pain following fall. Initial
encounter.

EXAM:
DG HIP (WITH OR WITHOUT PELVIS) 2-3V RIGHT

[pelvis ap]
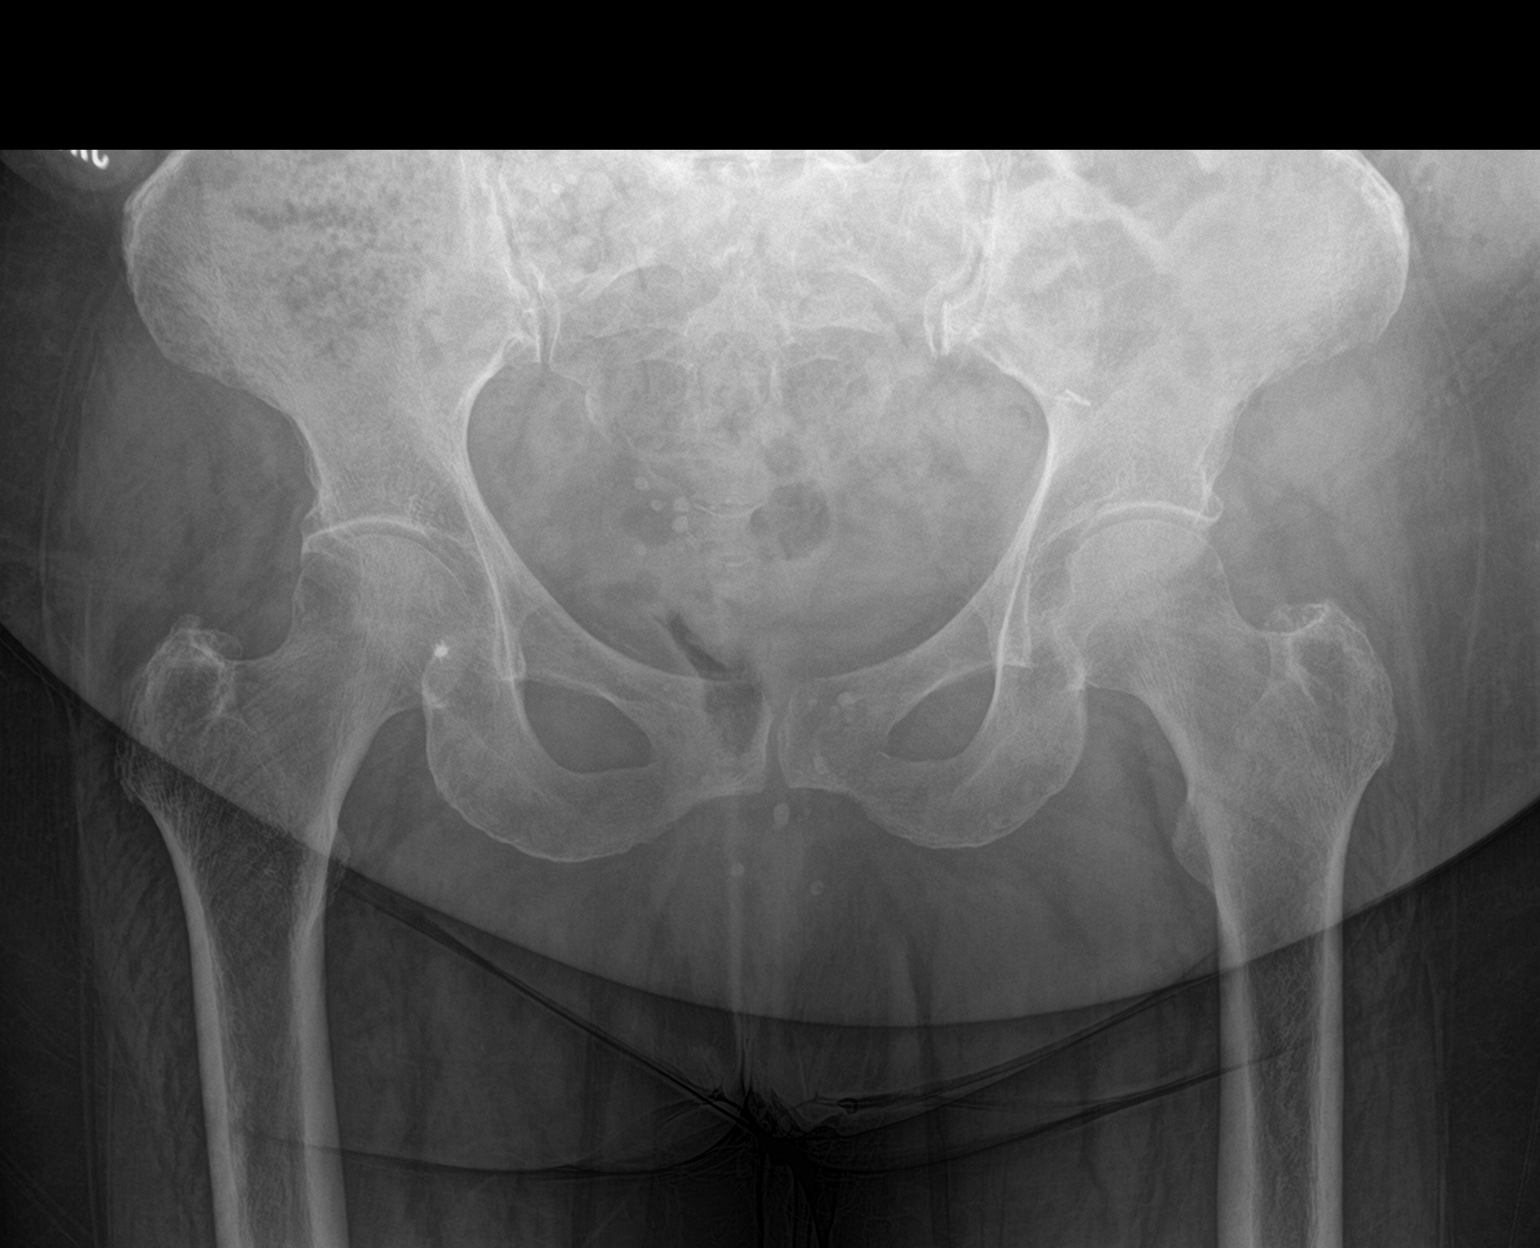

[hip ap]
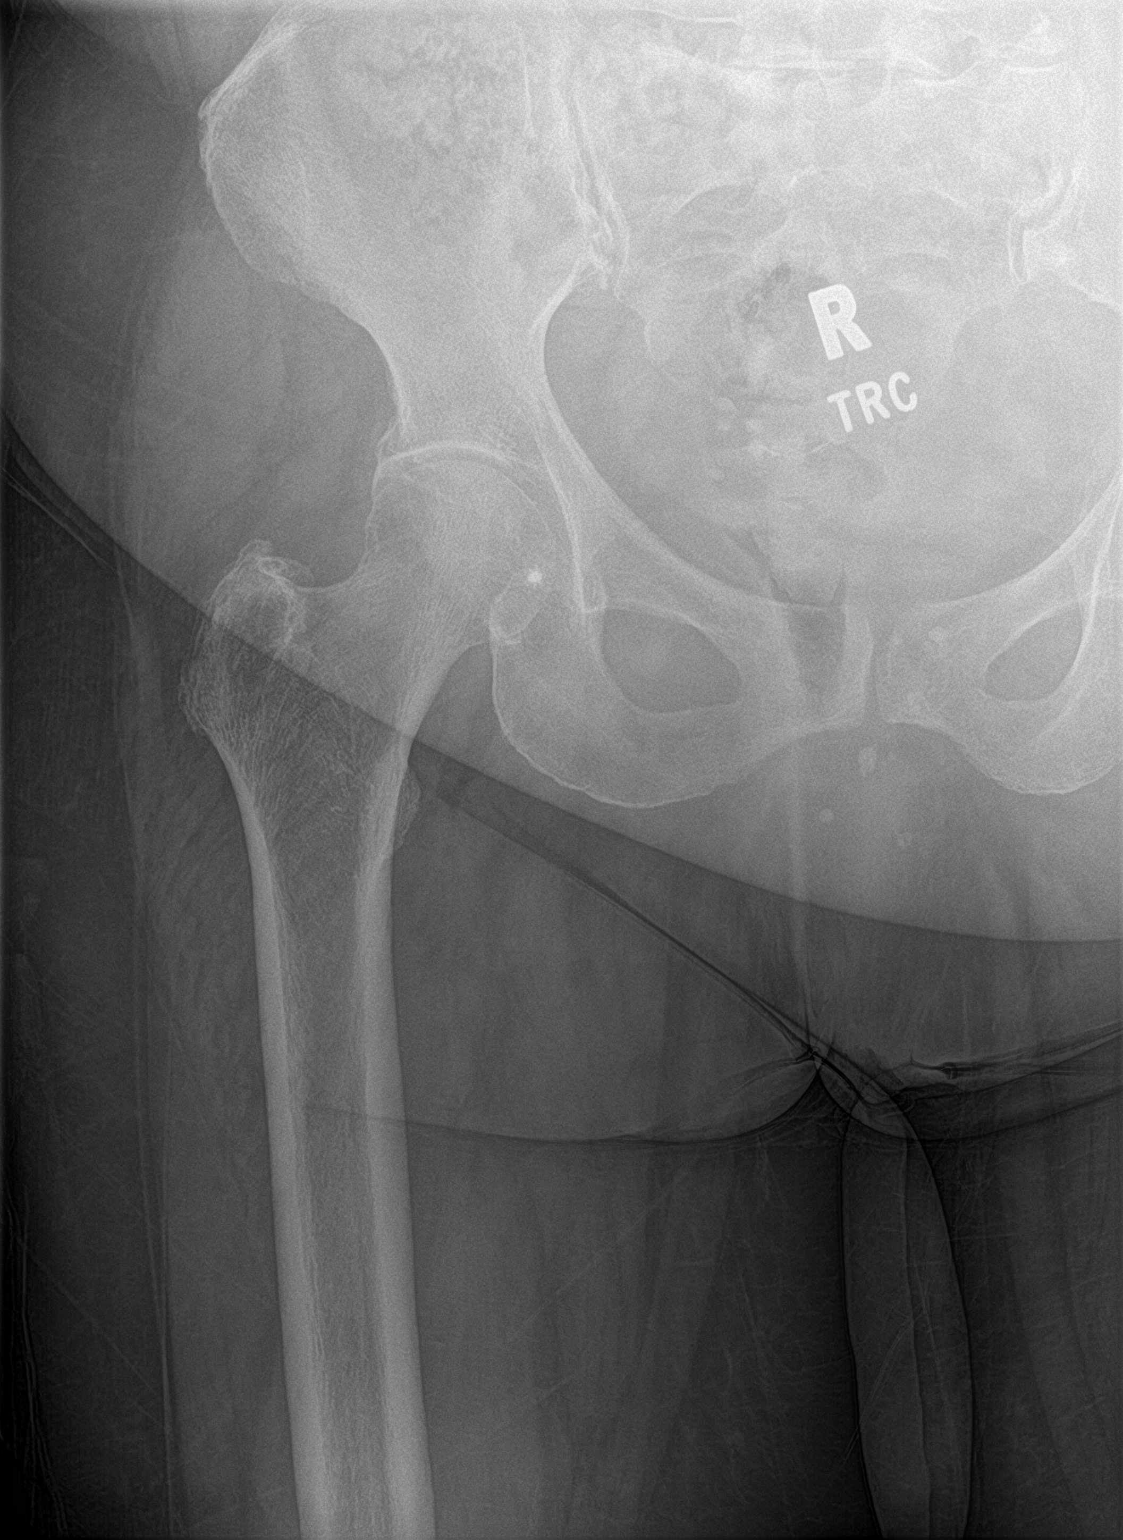

[hip lat]
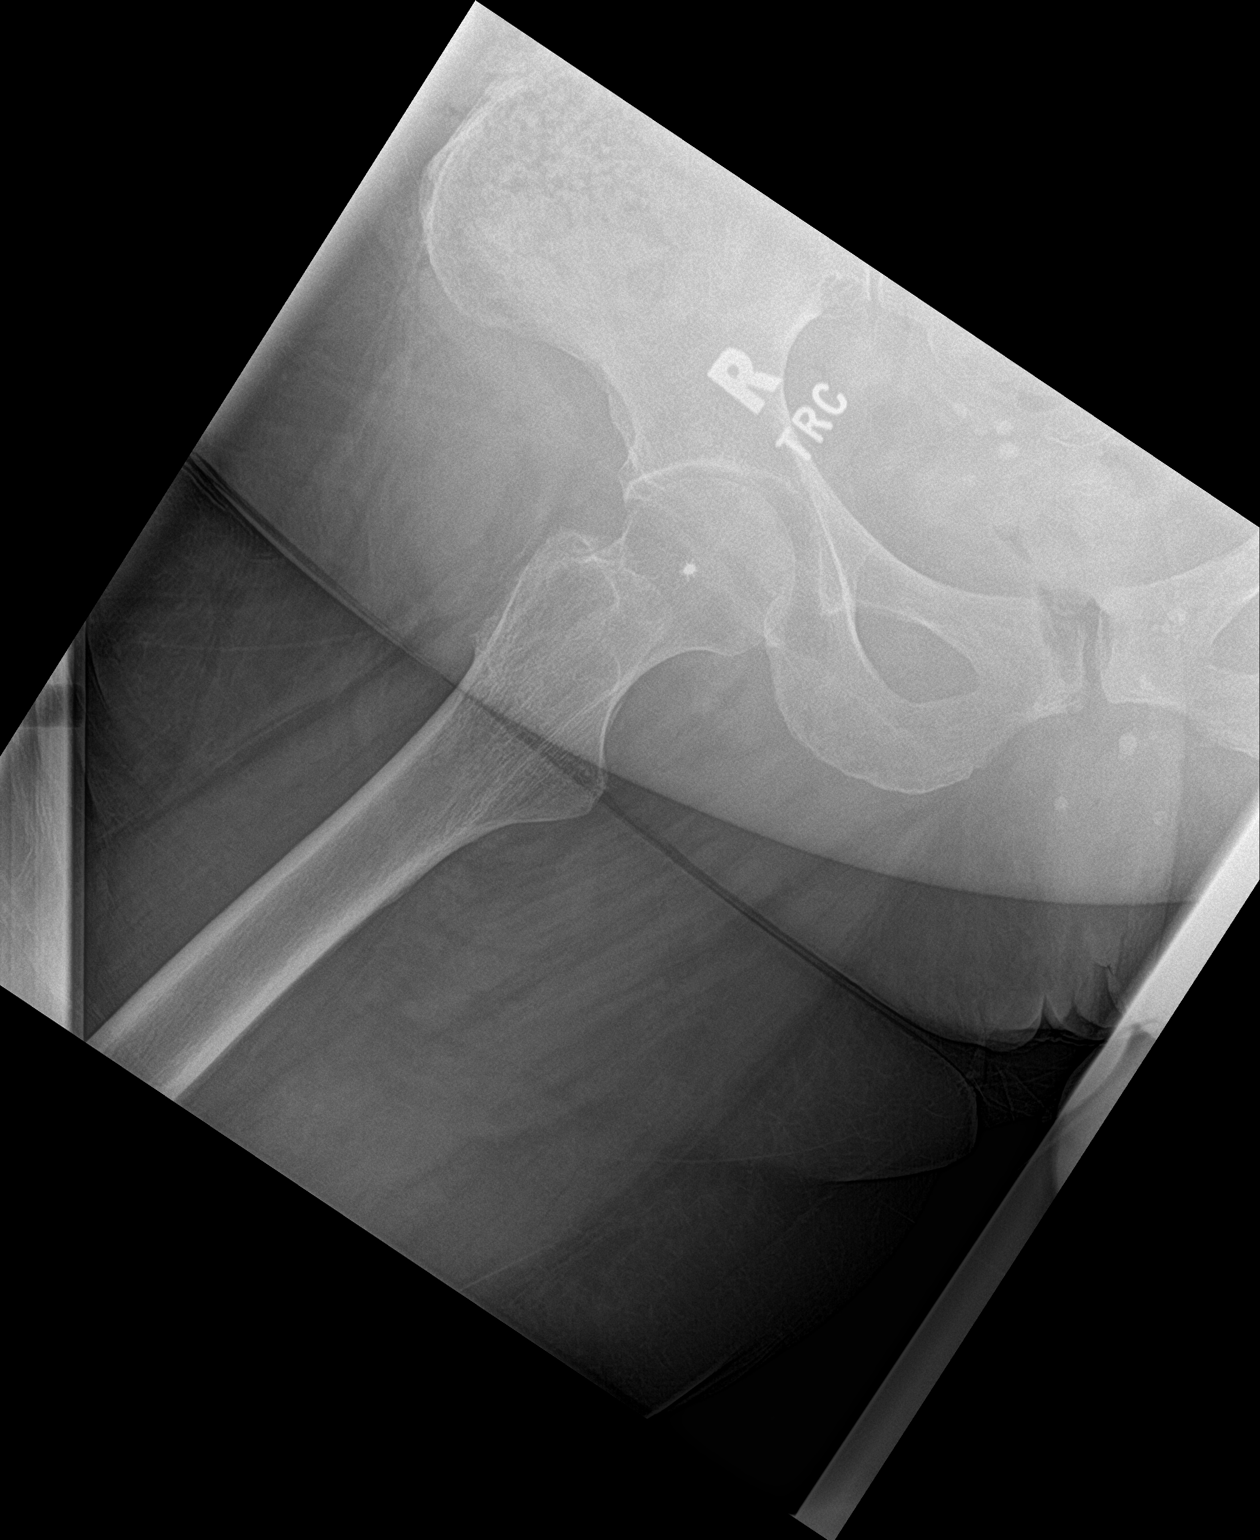

[3 of 3 positions shown; findings below may reference images not displayed]

FINDINGS: There is no evidence of hip fracture or dislocation. There is no
evidence of arthropathy or other focal bone abnormality.
IMPRESSION: Negative.
